# Patient Record
Sex: Male | Born: 1960 | Race: Black or African American | Hispanic: No | Marital: Single
Health system: Southern US, Community
[De-identification: ages and names within clinical notes are randomized; demographics above are authoritative.]

## PROBLEM LIST (undated history)

## (undated) DIAGNOSIS — B019 Varicella without complication: Secondary | ICD-10-CM

## (undated) DIAGNOSIS — M79609 Pain in unspecified limb: Secondary | ICD-10-CM

## (undated) DIAGNOSIS — G4700 Insomnia, unspecified: Secondary | ICD-10-CM

## (undated) DIAGNOSIS — G4733 Obstructive sleep apnea (adult) (pediatric): Secondary | ICD-10-CM

## (undated) DIAGNOSIS — J3489 Other specified disorders of nose and nasal sinuses: Secondary | ICD-10-CM

## (undated) HISTORY — DX: Other specified disorders of nose and nasal sinuses: J34.89

## (undated) HISTORY — DX: Varicella without complication: B01.9

## (undated) HISTORY — PX: VASECTOMY: SHX001876

## (undated) HISTORY — DX: Obstructive sleep apnea (adult) (pediatric): G47.33

## (undated) HISTORY — DX: Pain in unspecified limb: M79.609

## (undated) HISTORY — DX: Insomnia, unspecified: G47.00

---
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
## 2006-09-16 ENCOUNTER — Ambulatory Visit

## 2006-10-18 ENCOUNTER — Ambulatory Visit: Admitting: FAMILY PRACTICE

## 2006-10-18 ENCOUNTER — Encounter: Payer: Self-pay | Admitting: FAMILY PRACTICE

## 2006-10-18 ENCOUNTER — Ambulatory Visit

## 2006-10-18 DIAGNOSIS — J3489 Other specified disorders of nose and nasal sinuses: Secondary | ICD-10-CM | POA: Insufficient documentation

## 2006-10-18 DIAGNOSIS — M79609 Pain in unspecified limb: Secondary | ICD-10-CM | POA: Insufficient documentation

## 2006-10-18 DIAGNOSIS — G4700 Insomnia, unspecified: Secondary | ICD-10-CM | POA: Insufficient documentation

## 2006-10-18 HISTORY — DX: Insomnia, unspecified: G47.00

## 2006-10-18 HISTORY — DX: Other specified disorders of nose and nasal sinuses: J34.89

## 2006-10-18 HISTORY — DX: Pain in unspecified limb: M79.609

## 2006-10-18 MED ORDER — AMBIEN CR 6.25 MG TABLET,EXTENDED RELEASE
EXTENDED_RELEASE_TABLET | ORAL | Status: DC
Start: 2006-10-18 — End: 2007-01-16

## 2006-10-18 MED ORDER — FLONASE 50 MCG/ACTUATION NASAL SPRAY,SUSPENSION
NASAL | Status: DC
Start: 2006-10-18 — End: 2009-05-09

## 2006-10-18 NOTE — Nursing Note (Signed)
>>   Wynona Luna, LVN, LVN     Mon Oct 18, 2006 10:33 AM  Immunization VIS documentation(s) were given to Tish Men to review. All questions were answered and the patient consented to the Immunization(s) being given. Patient allergies were reviewed and no contraindications were found. The immunization(s) were given as ordered. The patient was observed for any immediate reactions to the vaccine. None were observed. Wynona Luna, LVN    >> AMELIA Hinton Dyer, LVN     Mon Oct 18, 2006 10:07 AM  Vital signs taken, allergies verified, screened for pain, med hx taken (if appropriate),  screened for chicken pox, and verified immunization status.  Warren Lacy, LVN

## 2006-10-18 NOTE — Patient Instructions (Addendum)
New lab tests have been ordered. Please do these tests after a 12 hour fast (nothing to eat or drink except water) in 1-2 weeks. The lab tests have been ordered through the computer, no paper lab slip is needed. The lab is open from 8:00 AM to 4:15 Monday to Friday, and an appointment is needed at the lab. The lab is located on the first floor of the clinic building in the waiting area for [address].    HOW DO I E-MAIL MY DOCTOR?  To contact your physician by e-mail go on-line at [email or web address]. Look up the name of your primary care physician, then you're ready to send a secured, confidential e-mail.

## 2006-10-18 NOTE — Progress Notes (Signed)
See dictation.  Barriers to Learning: none. Patient/Family Understanding: verbalizes.    Kodee Ravert C Keir Viernes, M.D.

## 2006-10-19 NOTE — H&P (Addendum)
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 He has a history of chronic foot pain related to an old soccer injury. He recently developed an abscess requiring incision and drainage on the same foot. He has seen multiple podiatrists in the past. He is interested in seeing a podiatrist. Cannot think of the podiatrist's name. He has a history of insomnia. Uses Ambien CR intermittently. He has a history of snoring. He has used nasal steroid. Is interested in retrying nasal steroid. No fever, chills. No cough. No congestion. No change in bowel or bladder pattern.     PAST HISTORY:    Right foot pain, insomnia, possible seasonal allergic rhinitis. RGERY: Appendectomy at 5. ALLERGIES: PENICILLIN. CURRENT MEDS: Ambien CR p.r.n., Benadryl p.r.n.    HABITS:    No tobacco. Quit alcohol four months ago. No recreational drugs.     SOCIAL HISTORY:    Is an Conservator, museum/gallery. Single. One son age 31. Has a significant other.    FAMILY HISTORY:    Mother with congestive failure. Father died of a stroke. Had hypertension.     HEALTH CARE MAINTENANCE:    Last tetanus 10 years ago. Had a cholesterol couple years ago, which as far as he knows was normal. He has no other risk factors for cardiovascular disease.    OBJECTIVE:    Blood pressure 120/60, temperature 36.1, pulse 80, respiratory rate 20, weight 167, height 5 feet 7 inches, BMI 26. In general, he appears comfortable. Sclerae are anicteric. Conjunctivae pink. Nares patent. Oropharynx moist. Neck veins flat. Lungs: Clear. Heart: Regular rate and rhythm. Abdomen is soft, nontender. Extremities are warm. No cyanosis. No edema. Prostate is normal, no masses.    ASSESSMENT:     1. Health care maintenance. Plan: Nutritional counseling. Exercise counseling. Education regarding safety. Will provide him a  DTAP. Will check a lipid and a comprehensive panel.   2. Right foot pain. History of infection. Plan: Obtain old records. Check blood count, C-reactive protein. Will let me know the name of the physician he would like to see and I will complete a referral.  3. Insomnia. Plan: Refill Ambien CR.  4. Snoring. May be allergy. Plan: Trial of Flonase. Further evaluation pending response to therapy.          THIS WAS ELECTRONICALLY SIGNED - 10/19/2006 9:56 AM PST BY: Cristal Generous, MD  [facility] [place]  [facility]                [id number])    D: 10/18/2006 04:44 PM  T: 10/18/2006 06:59 PM  C#: [id number]

## 2006-10-28 ENCOUNTER — Encounter

## 2006-11-01 ENCOUNTER — Other Ambulatory Visit: Payer: Self-pay | Admitting: FAMILY PRACTICE

## 2006-11-02 ENCOUNTER — Telehealth: Payer: Self-pay | Admitting: FAMILY PRACTICE

## 2006-11-02 NOTE — Telephone Encounter (Signed)
Please schedule appointment tomorrow so I can check his arm.  Iwould also like to discuss results of lab test.

## 2006-11-02 NOTE — Telephone Encounter (Signed)
Patient had labs done on October 28, 2006 having blood drawn from right arm below elbow.  He has a bruise that he just noticed that is about the size of his fist.  It is sore but not anything major.  He usually does not bruise.  He is not really concerned about it but was surprised.  Please call to advise.    Beryle Beams

## 2006-11-03 NOTE — Telephone Encounter (Signed)
Spoke to pt. Offered appt byt he ws in [place]. Pt advised to call if symtoms get worse or do not get better for advisement on what to do while out of the state.

## 2006-11-09 ENCOUNTER — Encounter: Payer: Self-pay | Admitting: FAMILY PRACTICE

## 2006-11-16 ENCOUNTER — Ambulatory Visit

## 2006-11-19 ENCOUNTER — Encounter

## 2006-11-19 ENCOUNTER — Telehealth: Payer: Self-pay | Admitting: FAMILY PRACTICE

## 2006-11-19 NOTE — Telephone Encounter (Signed)
Call patient and tell him labs results will not be available until next week.  I will notify him of the results when available.

## 2006-11-19 NOTE — Telephone Encounter (Signed)
Patient had lab work done today and isnt sure if he needs a follow up appointment to discuss these labs, please call to let him know,  Amy Stilwell,mOSC II

## 2006-11-22 NOTE — Telephone Encounter (Signed)
Patient notified. Sonnie Pawloski Raya-Rowe, MA II

## 2006-11-24 DIAGNOSIS — R894 Abnormal immunological findings in specimens from other organs, systems and tissues: Secondary | ICD-10-CM | POA: Insufficient documentation

## 2007-01-16 ENCOUNTER — Other Ambulatory Visit: Payer: Self-pay | Admitting: FAMILY PRACTICE

## 2007-01-16 MED ORDER — AMBIEN CR 6.25 MG TABLET,EXTENDED RELEASE
EXTENDED_RELEASE_TABLET | ORAL | Status: DC
Start: 2007-01-16 — End: 2007-11-20

## 2007-07-25 ENCOUNTER — Ambulatory Visit

## 2007-07-27 ENCOUNTER — Ambulatory Visit: Admitting: FAMILY PRACTICE

## 2007-07-27 MED ORDER — AMBIEN 10 MG TABLET
ORAL_TABLET | ORAL | Status: DC
Start: 2007-07-27 — End: 2007-11-20

## 2007-07-27 NOTE — Patient Instructions (Signed)
An electronic referral has been submitted to [facility] for further evaluation and treatment of your problem. You should receive an approval notice in the mail within 2 weeks. After the approval notice has been received, or if a notice is not received within two weeks, you may call [phone] or [phone] to schedule your appointment.        An electronic referral has been submitted to DERMATOLOGY for further evaluation and treatment of your problem. You should receive an approval notice in the mail within 2 weeks. After the approval notice has been received, or if a notice is not received within two weeks, you may call [phone] or [phone] to schedule your appointment.

## 2007-07-27 NOTE — Nursing Note (Signed)
>>   Dustin Ibarra     Wed Jul 27, 2007 10:55 AM  Vitals Taken. [name], [facility]

## 2007-07-27 NOTE — Progress Notes (Signed)
SUBJECTIVE: Dustin Ibarra is a 46yr old male who comes in today for the following problem(s):     Chief Complaint   Patient presents with    Mole     upper Lt side of back     Patient states that he has had a mole on his left upper back for a number of years that has recently changed. He states that his girlfriend noticed it to be getting bigger in size and changing in shape. He denies any bleeding or pain. He states that he has sensitive skin and has had sunburns in the past. No family history of skin cancers though his father had some skin growths removed.    He also complains of having sleep problems. He is currrently taking Ambien CR 6.25mg , but is still having trouble sleeping. He states that he snores and was wondering if a sleep study would be appropriate.    OBJECTIVE: BP 120/70  Pulse 72  Temp (Src) 37 C (98.6 F) (Oral)  Resp 12  Wt 78.926 kg (174 lb)     General Appearance: distress - no distress.  Skin:  Left upper back: asymmetrical, dark brown to black lesion with irregular borders.   Approximately 9 x9 mm in greatest length and width.  Neck and axilla: no adenopathy appreciated      ASSESSMENT/PLAN    216.9AC Mole (Skin)  Comment: DDx: superficial spreading melanoma  Plan: DERMATOLOGY CLINIC REFERRAL           780.52 INSOMNIA UNSPECIFIED  Comment: DDx: sleep apnea  Plan: AMBIEN 10 MG TAB, SLEEP STUDY    Patient visit was proctored by Dr. Renette Butters.    See Orders, Patient Instructions, and Follow-up sections.    Barriers to Learning assessed: none. Patient verbalizes understanding of teaching and instructions.    [name], M.D.

## 2007-07-27 NOTE — Progress Notes (Signed)
See dictation.  Barriers to Learning: none. Patient/Family Understanding: verbalizes.    Quandarius Nill C Adalee Kathan, M.D.

## 2007-07-28 NOTE — Progress Notes (Addendum)
PATIENT: Dustin Ibarra LOCATION: [facility]   MR #: [id number] SEX: M AGE: 46  DATE OF SERVICE: 07/27/2007 DOB: Jul 23, 1961    [place] CLINIC NOTE    Dustin Ibarra is a 46 year old male who is presenting for evaluation of a mole on his left shoulder. It has been present since his teens. He has had it evaluated by physicians in the past. Recently was seen by a nurse practitioner who felt it had some suspicious characteristics and thought he should be evaluated by me. Dustin Ibarra is bothered by insomnia. Has snoring, daytime somnolence. He is interested in a sleep study.     PAST HISTORY:    Unchanged. Reviewed on problem list. See problem list. CURRENT MEDS: Unchanged. Reviewed on med list. See med list.    OBJECTIVE:    Vital signs are stable. He is afebrile. On his left shoulder he has a 9 mm unevenly pigmented with uneven borders and asymmetric lesion that resembles melanoma. Lungs: Clear. Heart: Regular rate and rhythm. Abdomen is soft, nontender. Extremities are warm.     ASSESSMENT:     1. Left upper back mole suspicious for melanoma. Plan: Urgent derm referral.  2. Insomnia, some features of obstructive sleep apnea. Plan: Referral for sleep study. Will change Ambien CR to Ambien 10 mg. Reviewed risks, benefits. He will followup with me after sleep study has been completed and derm eval completed.        THIS WAS ELECTRONICALLY SIGNED - 07/28/2007 6:26 AM PST BY: Cristal Generous, MD  [facility] [place]  [facility]                [id number])    D: 07/27/2007 03:24 PM  T: 07/27/2007 07:26 PM  C#: [id number]

## 2007-08-02 ENCOUNTER — Ambulatory Visit

## 2007-08-03 ENCOUNTER — Ambulatory Visit: Admitting: Dermatology

## 2007-08-03 ENCOUNTER — Other Ambulatory Visit: Payer: Self-pay | Admitting: Dermatology

## 2007-08-03 VITALS — BP 118/72 | HR 88 | Resp 20

## 2007-08-03 NOTE — Nursing Note (Signed)
>>   Inocencio Homes, RN     Wed Aug 03, 2007  6:21 PM  Post-op picture final layer/measurements taken?  yes -   Post closure picture / measurements taken?  no  Dressing:  yes - vasaline telfa pressure drsg applied Inocencio Homes, RN    Wound care Instructions given and patient understanding?  yes - rev'd instructions w/ patient and sig other,all questions answered and phone numbers given      Inocencio Homes, RN    >> Inocencio Homes, RN     Wed Aug 03, 2007  5:10 PM  Introduced self when first meeting pt:    yes -     2 Identifiers used to identify pt- Name / Birth date:  yes -   Family shown to Mohs waiting room:  no    Patient takes Aspirin /Vitamin E:   no    Patient On Anticoagulants:   no  Does patient need ABx before Dental Work:  no  Artificial implants/Catheters:  no  Physician notified if YES for any above:  yes -   Consent completed / explained /and Signed by pt.:  yes - Dr Hilda Blades  Armband on?  no  Has pt eaten?  yes -     If Yes, when? Today 15:00, last?   Referring Physician documented?:   yes - ,  Histology / Diagnosis documented:   yes - ,r/oatypical nevus  Allergies checked:   yes -  ,   -- Pcn (Penicillin) (Penicillins) -- Hives    --  Given shot for reaction.  Photos present:   no  Surgery site agreed to by patient prior to procedure:  yes -   Anesthetic labeled and available:  yes -   Vitals taken and charted:   yes - , BP 118/72   Pulse 88   Resp 20  Time out taken:   yes -      Mindy Epperson,RN    >> EMMA COVINGTON, LVN     Wed Aug 03, 2007  4:20 PM  Vital signs taken, allergies verified, screened for pain, med hx taken.  Kara Mead [place],LVN

## 2007-08-04 ENCOUNTER — Encounter: Payer: Self-pay | Admitting: FAMILY PRACTICE

## 2007-08-04 NOTE — Progress Notes (Signed)
Dear Dr. Renette Butters,    Thank you for this consultation.    HPI: As you know, [name] is a delightful 46 year-old man who presents to our dermatology clinic at your request in consultation for evaluation and treatment of a mole on his left shoulder. The patient reports this mole, which has been "present since birth", has been growing in size and changing in color over the last 1-2 year(s). Lastly, Dustin Ibarra presents with a 52-month history of a "rough spot" above his left eyebrow.    The patient reports moderate sun exposure but no blistering sunburns as a child. He does not use sunscreens with SPF 30 daily.  Of note, patient has no history of skin cancer and no known family history of melanoma.    [Elements in the HPI above include primary problem, its associated location, duration, and quality, as well as any additional problems the patient is presenting at this visit.]    Review of systems: All systems negative except as noted in the HPI.  Education needs were identified. There were no barriers to learning.  Past Medical, Family, Social History reviewed in EMR and with the patient on 08/03/07.  No changes are noted.  Allergies: reviewed as per patient questionnaire dated 08/03/07.  Medications: reviewed as per patient questionnaire dated 08/03/07.    Physical Examination/ Assessment/ Plan  On physical examination, the patient is in no apparent distress and breathing comfortably. Vital signs were noted. The patient is well nourished and pleasant. The patient is alert to time, place and person. Inspection of the head, face, neck, chest, back including spine, abdomen, buttocks, right and left upper and lower extremities was performed. The examination of the above areas are within normal limits, except for any abnormalities are noted below.    Assess for melanoma: 9x62mm dark dark-brown patch with irregular borders and a hyperpigmented center located on the left shoulder.  --Excisional biopsy was  performed.    Preoperative diagnosis: assess for melanoma  Postoperative diagnosis: assess for melanoma  Location: left shoulder  Preoperative medications: none  Postoperative medications: none  Anesthetic:  Buffered 1% lidocaine 1:100,000 epinephrine.  Antiseptic:  Chlorhexidine  Attending surgeon: Airen Stiehl MD  Assistant(s): Hayden Pedro MD , Mindy Epperson RN  Estimated blood loss: minimal  Complications: none  Initial lesion size: 9x51mm  Surgical margin: 2mm  Excised lesion size: 1.1cmx1.1cm  Closure type: intermediate layered closure  Length of closure: 2.8 cm  Suture material: 4.0 prolene, 4.0 vicryl    Dustin Ibarra was brought back to the minor surgery operating room.  A time out was performed to confirm his identity and the correct operative site, which he pointed out and which was correlated with chart information. The risks of the procedure, including bleeding, infection, nerve damage, possibility of recurrence, failure of the repair, and the certainty of a scar were discussed with him. Alternatives to the procedure, as well as their risks and benefits, were also discussed.  He was offered an opportunity to ask any questions and, after expressing understanding of the proposed procedure and its alternatives, he signed the consent form. He was placed in a recumbent position and the area was prepared and draped in the usual manner.  Local anesthesia was obtained with the above mentioned anesthetic. Using a sterile surgical marking pen, a eliptical skin incision template was designed around the lesion and along relaxed skin tension lines, incorporating a 2 mm margin of normal-appearing skin. A full thickness skin incision using a #15 blade  Bard-Parker scalpel was performed and the lesion was excised sharply in the subcutaneous plane. The specimen was submitted for histopathologic examination. An intermediate layered closure was performed. Hemostasis was maintained with the electrosurgical unit. Interrupted,  inverted, subcuticular buried mattress sutures were placed using the material mentioned in the summary. Running/Interrupted simple cutaneous sutures were used to further approximate and evert the wound edges. The final length of the repair is listed in the summary above. A firm pressure dressing was applied over bacitracin ointment and Telfa. Verbal postoperative wound care instructions were discussed and a wound care hand out was given. He was asked to follow up in 2 weeks for a wound check/suture removal and discussion of the histopathology report. He was also told to call us at anytime for signs of wound infection or if he has any other concerns regarding the surgery or the healing process. He tolerated the procedure well and walked from the minor surgery operating room without apparent ill effect.     Actinic Keratosis: Total of 1 erythematous, gritty patch located above the left eyebrow.  --After obtaining verbal consent, 1 lesion was treated with liquid nitrogen x 2 with a 10 second thaw interval. Patient was informed of the potential for erythema, blister formation, scar, and post inflammatory pigment changes at the site of treatment.    Benign-appearing nevi - elsewhere: Multiple tan to brown macules and papules with even border and color and benign pigment network under epiluminescence microscopy on the trunk, upper extremities, and lower extremities.  --Pt was encouraged to perform self-skin examination approximately once a month to observe any changes in these currently benign-appearing nevi.    Cherry angiomas: Scattered brightly erythematous, vascular papules on the trunk and extremities.  --Benign nature of these skin lesions was explained to the patient.    Solar lentigines: Multiple tan to brown, reticulate macules with moth-eaten border on ELM consistent with lentigines on the face, upper back, and arms.  --Wynelle Link protection was reviewed with the patient, including using a sunscreen with broadspectrum  protection with SPF of at least 30 and above, with frequent re-application.    Thank you for allowing me to participate in the care of this patient.  We have asked Mr. Ranieri to return to clinic in 12 months or sooner as needed.    Hayden Pedro, MD  Clinical Fellow    I performed the procedures. This patient was seen, evaluated, and care plan was developed with the fellow. I agree with the findings and plan as outlined in the fellow's note above.     [name], MD  Assistant Clinical Professor of Dermatology

## 2007-08-06 ENCOUNTER — Encounter: Payer: Self-pay | Admitting: Dermatology

## 2007-08-06 DIAGNOSIS — D039 Melanoma in situ, unspecified: Secondary | ICD-10-CM | POA: Insufficient documentation

## 2007-08-16 ENCOUNTER — Telehealth: Payer: Self-pay | Admitting: Dermatology

## 2007-08-16 ENCOUNTER — Ambulatory Visit

## 2007-08-16 NOTE — Nursing Note (Signed)
>>   Dustin Ibarra     Tue Aug 16, 2007  1:55 PM  Running Sutures removed from left shoulder. [email or web address]

## 2007-08-16 NOTE — Telephone Encounter (Addendum)
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 Patient's last visit with me was on August 03, 2007.  I discussed the significance, prognosis, and treatment pertaining to his melanoma in situ.  Dustin Ibarra will have a re-excision appointment with me on September 07, 2007.    [name], MD

## 2007-08-16 NOTE — Progress Notes (Signed)
Telephone Encounter   08/16/2007    I spent 22 minutes discussing with the patient significance of his diagnosis of melanoma in situ. His last visit to my office was on 07/31/07. I explained the significance, prognosis, and the treatment for melanoma in situ.  I have asked Dustin Ibarra to return to my clinic for re-excision of this lesion on September 07, 2007.    [name], MD

## 2007-08-31 ENCOUNTER — Encounter: Payer: Self-pay | Admitting: FAMILY PRACTICE

## 2007-09-01 ENCOUNTER — Ambulatory Visit

## 2007-09-07 ENCOUNTER — Encounter: Admitting: FAMILY PRACTICE

## 2007-09-07 ENCOUNTER — Ambulatory Visit

## 2007-09-07 ENCOUNTER — Other Ambulatory Visit: Payer: Self-pay | Admitting: Dermatology

## 2007-09-07 ENCOUNTER — Ambulatory Visit: Admitting: Dermatology

## 2007-09-07 VITALS — BP 136/84 | HR 74 | Resp 16

## 2007-09-07 NOTE — Progress Notes (Signed)
09/07/2007    HPI:  Mr. 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  He had no complications with healing after his last biopsy.  At his last visit, he had an actinic keratosis on his left eyebrow treated with cryotherapy, which he now believes is gone.   He has no new complaints today.    Review of systems: All systems negative except as noted in the HPI.  Education needs were identified. There were no barriers to learning.  Past Medical, Family, Social History reviewed in EMR and with the patient on 08/03/07.  No changes are noted.  Allergies: reviewed as per patient questionnaire dated 08/03/07.  Medications: reviewed as per patient questionnaire dated 08/03/07.    Physical Examination/ Assessment/ Plan  On physical examination, the patient is in no apparent distress and breathing comfortably. Vital signs were noted.  The patient is alert to time, place and person. Inspection of the head, face, and back was performed. The examination of the above areas are within normal limits, except for any abnormalities are noted below.    -- 2.2 x 1.0 cm erythematous scar on left posterior shoulder  -- no evidence of residual actinic keratosis on left eyebrow    Assessment and plan for melanoma in situ:    Re-excision with 5 mm margins today    Procedure Type: Excision  Location: left posterior shoulder  Preoperative diagnosis: melanoma in situ  Postoperative diagnosis: same  Preoperative medications: none  Antiseptic: chlorhexidine  Anesthesia: 12.5 cc total of 1% Lidocaine with epinephrine   Size of Lesion: 2.2 cm  Margin: 5 mm  Length of Closure: 6.2 cm  Sutures Used: 3-0 vicryl, 4-0 prolene  Closure type: intermediate  Drains: None  Estimated blood loss: minimal  Complications: none  Findings: none  Sterile Bandage and antibiotic applied: yes  Specimen sent to Pathology: yes  Discharge Medications: none  Patient  Information given: yes  The patient was brought back to the minor surgery operatory.  A time out was performed to confirm the patient's identity and the correct operative site, which the patient pointed out. The risks of the procedure, including a certain scar, bleeding, infection, nerve damage, failure to accomplish the goals of the surgery, and failure of the procedure were explained to the patient. Alternatives to the procedure were also discussed and the patient was offered an opportunity to ask any questions.  After expressing understanding of the proposed procedure, the patient signed the consent form.     The patient was placed in a recumbent position and the area was prepped and draped in the usual manner. Local anesthesia was obtained with infiltration of the above mentioned anesthetic. Using a sterile surgical marking pen, an elliptical skin incision was designed around the lesion and along relaxed skin tension lines, if possible, incorporating the aforementioned margin of normal-appearing skin to assure removal of the lesion.  A full thickness of skin incision using a 15 blade Bard-Parker scalpel was performed and the lesion was excised sharply to the subcutaneous plane.  The specimen was submitted for histopathologic examination.     The defect was moderately undermined in the subcutaneous plane to reduce wound closure tension and allow for easy wound edge eversion.  Hemostasis was maintained with the electrosurgical unit.  Interrupted, inverted, subcuticular buried mattress sutures were placed using the material mentioned above to approximate the dermal edges of the  wound.    Interrupted and running simple cutaneous sutures were used to further approximate and evert the wound edges.  The final length of the repair is listed in the summary above.     A firm pressure dressing was applied over bacitracin ointment and Telfa.   Verbal postoperative wound care instructions were given and a wound care hand out  was dispensed.  The patient was asked to follow up in 2 weeks for a wound check/suture removal and discussion of the histopathology report. The patient was also told to call us at anytime for signs of wound infection or any other concerns they might have regarding the surgery or the healing process. The patient tolerated the procedure well and ambulated from the minor surgical operatory.      Actinic Keratosis:  There is evidence of residual actinic keratosis on exam.  There is no need for further treatment at this time.    Thank you for allowing me to participate in the care of this patient.  We have asked Mr. Kagel to return to clinic in 2 weeks for suture removal and again in 3 months for full skin exam.    The patient was staffed with Dr. Hilda Blades.    Jacquelynn Cree, MD

## 2007-09-07 NOTE — Nursing Note (Signed)
>>   Evalina Field, MA     Wed Sep 07, 2007  5:14 PM  Post-op picture final layer/measurements taken?  yes -   Post closure picture / measurements taken?  yes -   Dressing:  yes - Pressure Dressing by Evalina Field.M.A.  Wound care Instructions given and patient understanding?  yes -      >> Inocencio Homes, RN     Wed Sep 07, 2007  4:46 PM  Patient prepared for reexcision of L back near shoulder by Dr Hilda Blades.    >> Evalina Field, MA     Wed Sep 07, 2007  3:56 PM  vital signs taken, allergies verified, screened for pain, meds verified, pharmacy verified.  Evalina Field, MA I

## 2007-09-08 NOTE — Progress Notes (Signed)
I was present throughout the procedure. This patient was seen, evaluated, and care plan was developed with the resident. I agree with the findings and plan as outlined in the resident's note above.     April Armstrong, MD  Assistant Clinical Professor of Dermatology

## 2007-09-13 ENCOUNTER — Telehealth: Payer: Self-pay | Admitting: Dermatology

## 2007-09-13 NOTE — Telephone Encounter (Signed)
Patient returning your call. You may leave a detailed message on his cell phone voice mail. Leander Rams, Sr. [place]

## 2007-09-20 ENCOUNTER — Ambulatory Visit

## 2007-09-21 ENCOUNTER — Ambulatory Visit

## 2007-09-21 NOTE — Nursing Note (Signed)
>>   Inocencio Homes, RN     Wed Sep 21, 2007  1:50 PM  Pt here for suture removalon L posterior shoudler. Suture line approximated. No bleeding patient states no pain and left feeling well. Inocencio Homes, RN

## 2007-09-28 ENCOUNTER — Telehealth: Payer: Self-pay

## 2007-09-28 NOTE — Telephone Encounter (Signed)
Called patient. Small amount of bleeding stopped.

## 2007-09-28 NOTE — Telephone Encounter (Signed)
Pt called wanting to know if a small amount of bleeding was concerning.  Pt had an exc on R shoulder, sutures were removed last week. Pt awoke to a quarter size spot of blood on shirt.  Pt states he has been doing more this week.  Suggested slowing down and apply bandaid.  Inocencio Homes, RN

## 2007-10-12 ENCOUNTER — Ambulatory Visit

## 2007-10-12 NOTE — Nursing Note (Signed)
>>   Inocencio Homes, RN     Wed Oct 12, 2007  2:36 PM  Pt c/o pea size bleeding nightly from Lt post shoulder exc site.  Reexc was on 1/21.  Denies pain. Two spitting sutures removed on both lateral ends.  No bleeding noted.  Pt directed to keep sutureline clean and covered especially in bed.  Inocencio Homes, RN

## 2007-11-20 ENCOUNTER — Other Ambulatory Visit: Payer: Self-pay | Admitting: FAMILY PRACTICE

## 2007-11-20 MED ORDER — AMBIEN 10 MG TABLET
ORAL_TABLET | ORAL | Status: DC
Start: 2007-11-20 — End: 2008-05-22

## 2007-12-14 ENCOUNTER — Ambulatory Visit

## 2007-12-14 ENCOUNTER — Ambulatory Visit: Admitting: Dermatology

## 2007-12-14 VITALS — BP 108/80 | HR 86 | Resp 20

## 2007-12-14 NOTE — Progress Notes (Signed)
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 Today complaining of lesion on penis, present for "a long time," not sexually active, asymptomaic, concerned might be genital wart.     The patient's medical problem list was reviewed. The patient's ROS questionnaire dated 12/08 was reviewed, no changes were noted. Education needs were identified. There were no barriers to learning.    Physical Exam:   The patient is in no apparent distress and breathing comfortably.  Vital signs were noted. The patient is well nourished and pleasant, alert to time, place and person.  Inspection of the face, neck, chest, back, abdomen, upper extremities, lower extremities, buttocks, and genitals was performed.  The examination was found to be normal, with the following abnormalities:   - L upper back: well-healed scar, small hypertrophic scar at L edge  - Shaft of penis: two 7mm sharply-marginated papillomatous light brown flat papules, superior one is very flat  - No axillary or cervical lymphadenopathy     Assessment/Plan:   1. Genital warts:  - After obtaining verbal consent, 2 lesions were treated with liquid nitrogen for ~5 seconds, with a ~10 second thaw interval. The patient was informed of the expected skin reaction including erythema, pain, blister formation, and advised of the risk of hypo- or hyperpigmentation and scar at the site of treatment. The patient tolerated the treatment well.  - Return to clinic in 1 month    2. History of melanoma in situ on upper L back, excised 1/09:  - No evidence of recurrence or concerning lesions on exam today   - Q6 month full skin examinations

## 2007-12-14 NOTE — Nursing Note (Signed)
>>   SONIA NAEBKHAIL, MA     Wed Dec 14, 2007  1:15 PM  vital signs taken, allergies verified, screened for pain, meds verified, pharmacy verified.  Evalina Field, MA I

## 2007-12-14 NOTE — Progress Notes (Signed)
I was present throughout the procedure. This patient was seen, evaluated, and care plan was developed with the resident. I agree with the findings and plan as outlined in the resident's note above.     April Armstrong, MD

## 2008-02-15 ENCOUNTER — Ambulatory Visit

## 2008-02-15 ENCOUNTER — Ambulatory Visit: Admitting: Dermatology

## 2008-02-15 VITALS — BP 102/78 | HR 80 | Resp 14

## 2008-02-15 NOTE — Progress Notes (Signed)
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 Patient reports that the genital wart has resolved. The patient's medical problem list was reviewed. The patient's ROS questionnaire dated 12/08 was reviewed, no changes were noted. Education needs were identified. There were no barriers to learning.    Physical Exam:   The patient is in no apparent distress and breathing comfortably.  Vital signs were noted. The patient is well nourished and pleasant, alert to time, place and person.  Inspection of the face, neck, chest, back, abdomen, upper extremities, lower extremities, buttocks, and genitals was performed.  The examination was found to be normal, with the following abnormalities:   - L upper back: well-healed scar, small hypertrophic scar at L edge  - Shaft of penis: clear  - No axillary or cervical lymphadenopathy     Assessment/Plan:   1. Genital warts:  - After obtaining verbal consent, 2 lesions were treated with liquid nitrogen for ~5 seconds, with a ~10 second thaw interval. The patient was informed of the expected skin reaction including erythema, pain, blister formation, and advised of the risk of hypo- or hyperpigmentation and scar at the site of treatment. The patient tolerated the treatment well.  - Return to clinic in 1 month    2. History of melanoma in situ on upper L back, excised 1/09:  - No evidence of recurrence or concerning lesions on exam today   - Q6 month full skin examinations     Benign-appearing nevi: Multiple tan to brown macules and papules with even border and color and benign pigment network Epiluminescence microscopy on the trunk, upper extremities, and lower extremities.  --Pt was encouraged to perform self-skin examination approximately once a month to observe any changes in these currently benign-appearing nevi.    Seborrheic Keratoses: Multiple brown, verrucous, stuck-on papules on  the trunk.  --Reassurance was given as to the benignity of these growths.    Cherry angiomas: Scattered brightly erythematous, vascular papules on the trunk and extremities.  --Benign nature of these skin lesions was explained to the patient.    Solar lentigines: Multiple tan to brown, reticulate macules with moth-eaten border on ELM consistent with lentigines on the face, upper back, and arms.  --Wynelle Link protection was reviewed with the patient, including using a sunscreen with broadspectrum protection with SPF of at least 30 and above, with frequent re-application.      Thank you for allowing me to participate in the care of this patient.  We have asked [name] to return to clinic in 6 months or sooner as needed.    Sonia Side, MD

## 2008-02-15 NOTE — Nursing Note (Signed)
>>   Patrick North, MA     Wed Feb 15, 2008  1:02 PM  Patient BP, pulse and respirations taken. Allergies updated and verified. Medications updated. Screened for pain. Pharmacy information updated.  Patrick North MA I

## 2008-03-28 ENCOUNTER — Ambulatory Visit

## 2008-05-22 ENCOUNTER — Telehealth: Payer: Self-pay | Admitting: FAMILY PRACTICE

## 2008-05-22 MED ORDER — ZOLPIDEM 10 MG TABLET
1.0000 | ORAL_TABLET | Freq: Every day | ORAL | Status: DC | PRN
Start: 2008-05-22 — End: 2009-04-24

## 2008-05-22 NOTE — Telephone Encounter (Signed)
Patient walked into pharmacy and insisted they call primary care physician office for immediate refill on ambien.  Refill request created, and pharmacy advised to tell patient that covering physician would be seeing patients until approximately 5:30pm and may not be able to review messages any earlier.    Patient is apparently leaving town and needs refill today.

## 2008-05-22 NOTE — Telephone Encounter (Signed)
Call patient and tell him I refilled medication as requested.

## 2008-05-22 NOTE — Telephone Encounter (Addendum)
Left message on answering machine notifying patient of refill by Dr. Renette Butters.Lester Kinsman LVN

## 2008-05-23 ENCOUNTER — Other Ambulatory Visit: Payer: Self-pay | Admitting: FAMILY PRACTICE

## 2008-05-23 NOTE — Telephone Encounter (Signed)
Error

## 2008-05-29 ENCOUNTER — Ambulatory Visit

## 2008-06-13 ENCOUNTER — Ambulatory Visit: Admitting: FAMILY PRACTICE

## 2008-06-13 ENCOUNTER — Ambulatory Visit

## 2008-06-13 VITALS — BP 110/70 | HR 68 | Temp 98.6°F | Resp 16 | Wt 182.0 lb

## 2008-06-13 DIAGNOSIS — G4730 Sleep apnea, unspecified: Secondary | ICD-10-CM | POA: Insufficient documentation

## 2008-06-13 NOTE — Nursing Note (Signed)
>>   Dustin Ibarra     Wed Jun 13, 2008  5:29 PM  The patient completed the flu questionnaire and signed the consent. The Inactivated Influenza Vaccine VIS document was given to patient  to review and any questions were referred to the physician. The consent was then reviewed for any Yes answers.  All questions were answered as no. The Influenza Vaccine was then administered per protocol. The patient was observed for immediate reactions to the vaccine per protocol. None were observed.   Ellouise Newer, MA    >> [name]     Wed Jun 13, 2008  4:31 PM  Vital signs taken, allergies verified, screened for pain, med hx taken and chief complaint noted. Elease Hashimoto M.A.II

## 2008-06-13 NOTE — Progress Notes (Signed)
See dictation.  Barriers to Learning: none. Patient/Family Understanding: verbalizes.    Dustin Ibarra Dustin Ibarra, M.D.

## 2008-06-13 NOTE — Patient Instructions (Signed)
New lab tests have been ordered. Please do these tests in 1-2 weeks.  Fasting is NOT necessary.  The lab is open from 8:00 AM to 4:15 Monday to Friday and an appointment is needed.  No paper work is required as the orders exist in the electronic medical record. The lab is located on the first floor of the clinic building in the waiting area for [address].    An electronic referral has been submitted to RADIOLOGY for X-rays.  Please go to the X-ray department on the first floor to complete test.        An electronic referral has been submitted to ORTHOPEDICS for further evaluation and treatment of your problem. You should receive an approval notice in the mail within 2 weeks. After the approval notice has been received, or if a notice is not received within two weeks, you may call [phone] or [phone] to schedule your appointment.

## 2008-06-15 NOTE — H&P (Addendum)
PATIENT: Dustin, Ibarra LOCATION: [facility]   MR #: [id number] SEX: M AGE: 47   DATE OF SERVICE: 06/13/2008 DOB: 14-Jan-1961    [place] HISTORY AND PHYSICAL     Dustin Ibarra is a 47 year old man, who is presenting for annual exam. He is doing reasonably well. He continues to work closely with dermatologists. He has pain in his right great toe. He has been seeing an orthopedic surgeon in [place], treated with intraarticular steroids without much improvement. He denies fever, chills, cough, congestion, change in bowel or bladder pattern. He never followed through with consultation with the pulmonologist in regards to his mild sleep apnea.     PAST MEDICAL HISTORY:     Sleep apnea, melanoma in situ, right foot pain, insomnia, nasal cavity and sinus disease. Remote varicella, remote insomnia. Surgery: Appendectomy and melanoma excision. ALLERGIES: PENICILLIN. CURRENT MEDICATIONS: Ambien as needed for insomnia.     HABITS:     No tobacco, no alcohol, no recreational drug use.     SOCIAL HISTORY:     He is single and has one child. He works as an Conservator, museum/gallery.     IMMUNIZATIONS:    Had a tetanus shot 10/18/2006. Is requesting a flu shot today.     OBJECTIVE:     Blood pressure 100/70, temperature 37, pulse 68, respiratory rate 16, weight 182. In general, he looks comfortable. Sclerae: Anicteric. Conjunctivae: Pink. Nares: Patent. Oropharynx: Moist. Neck veins are flat. Lungs: Clear. Heart: Regular rate and rhythm. Abdomen is soft, nontender. Extremities are warm, there is no cyanosis, there is no edema. GU: Normal male genitalia. Testicles are descended bilaterally. Skin is unremarkable.     ASSESSMENT:     1. Health care maintenance. Plan: Nutritional counseling, exercise counseling, education regarding safety. Provide flu shot. Check vitamin D level.   2. Melanoma in situ. Plan: As per Derm.   3. Genital warts. Plan: These have been treated by Dermatology. Will screen for other STDs.   4. Sleep apnea. Plan: Refer to Pulmonology.   5.  Right 1st metatarsophalangeal joint pain, ongoing. Plan: Obtain radiographs. Refer to Orthopedic Surgery.           THIS WAS ELECTRONICALLY SIGNED - 06/15/2008 7:01 AM PST BY: Cristal Generous, MD    [facility]  [facility]                YQI:[id number])    D: 06/13/2008 06:31 PM  T: 06/14/2008 05:16 AM  C#: [id number]

## 2008-06-21 ENCOUNTER — Other Ambulatory Visit: Payer: Self-pay | Admitting: FAMILY PRACTICE

## 2008-06-21 ENCOUNTER — Encounter

## 2008-06-21 ENCOUNTER — Ambulatory Visit

## 2008-06-23 ENCOUNTER — Other Ambulatory Visit: Payer: Self-pay | Admitting: FAMILY PRACTICE

## 2008-06-23 DIAGNOSIS — E559 Vitamin D deficiency, unspecified: Secondary | ICD-10-CM | POA: Insufficient documentation

## 2008-06-23 MED ORDER — CHOLECALCIFEROL (VITAMIN D3) 25 MCG (1,000 UNIT) CAPSULE
1000.0000 [IU] | ORAL_CAPSULE | Freq: Every day | ORAL | Status: DC
Start: 2008-06-23 — End: 2008-10-22

## 2008-07-25 ENCOUNTER — Ambulatory Visit

## 2008-08-15 ENCOUNTER — Ambulatory Visit: Admitting: Dermatology

## 2008-08-15 NOTE — Progress Notes (Signed)
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 The patient's medical problem list was reviewed. His last full body skin check was on 02/15/2008.  Today. he reports that he has not noted any new, changing, itching, or bleeding lesions.     The patient's ROS questionnaire dated 08/15/08 was reviewed, no changes were noted. Education needs were identified. There were no barriers to learning.    Physical Exam:   The patient is in no apparent distress and breathing comfortably.  Vital signs were noted. The patient is well nourished and pleasant, alert to time, place and person.  Inspection of the face, neck, chest, back, abdomen, upper extremities, lower extremities, buttocks, and genitals was performed.  The examination was found to be normal, with the following abnormalities:   - L upper back: well-healed scar, small hypertrophic scar at L edge  - Shaft of penis: clear  - No axillary or cervical lymphadenopathy     Assessment/Plan:   1. History of melanoma in situ on upper L back, excised 1/09:  - No evidence of recurrence or concerning lesions on exam today, well-healed scar.  - Q6 month full skin examinations     2. Genital warts: resolved.    3. Benign-appearing nevi: Multiple tan to brown macules and papules with even border and color and benign pigment network Epiluminescence microscopy on the trunk, upper extremities, and lower extremities.  --Pt was encouraged to perform self-skin examination approximately once a month to observe any changes in these currently benign-appearing nevi.    4. Seborrheic Keratoses: Multiple brown, verrucous, stuck-on papules on the trunk.  --Reassurance was given as to the benignity of these growths.    5. Cherry angiomas: Scattered brightly erythematous, vascular papules on the trunk and extremities.  --Benign nature of these skin lesions was explained to the patient.    6. Solar  lentigines: Multiple tan to brown, reticulate macules with moth-eaten border on ELM consistent with lentigines on the face, upper back, and arms.  --Wynelle Link protection was reviewed with the patient, including using a sunscreen with broadspectrum protection with SPF of at least 30 and above, with frequent re-application.      Thank you for allowing me to participate in the care of this patient.  We have asked [name] to return to clinic in 6 months or sooner as needed.    Patient is staffed with Dr. Hilda Blades.    Derenda Mis, MD, PhD  Resident Physician, PGY-2  Pager Number: [phone]  Department of Dermatology  [facility]

## 2008-08-15 NOTE — Nursing Note (Signed)
>>   SONIA NAEBKHAIL, MA     Wed Aug 15, 2008  1:28 PM  vital signs taken, allergies verified, screened for pain, pharmacy verified.  Evalina Field, MA I

## 2008-08-17 NOTE — Progress Notes (Signed)
This patient was seen, evaluated, and care plan was developed with the resident. I agree with the findings and plan as outlined in the resident's note above.     April Wang Armstrong, MD  Attending Physician  Department of Dermatology  [facility]

## 2008-08-23 ENCOUNTER — Ambulatory Visit

## 2008-09-06 ENCOUNTER — Encounter: Payer: Self-pay | Admitting: FAMILY PRACTICE

## 2008-09-06 NOTE — Telephone Encounter (Signed)
From: Tish Men   ToSofie Rower   Sent: Thu Sep 06, 2008 1:38 PM   Subject: Referral Question    Hi Dr Renette Butters,  I was contacted by the orthopedic DR you referred me to for my foot but their contact info was lost.  Could you please send the info to me so I can schedule an appointment.    Also, your sleep clinic referral hasn't called to schedule yet so I need that as well.    Thank you,    Tish Men

## 2008-09-20 ENCOUNTER — Encounter: Payer: Self-pay | Admitting: FAMILY PRACTICE

## 2008-10-10 ENCOUNTER — Ambulatory Visit

## 2008-10-10 ENCOUNTER — Ambulatory Visit: Admitting: Orthopaedic Surgery

## 2008-10-10 VITALS — Resp 18 | Ht 69.0 in | Wt 180.0 lb

## 2008-10-10 NOTE — Nursing Note (Signed)
>>   Park Meo, LVN     Wed Oct 10, 2008  2:31 PM  vital signs taken, allergies/meds reviewed, pharmacy info. reviewed updated, chief complaint documented.

## 2008-10-16 ENCOUNTER — Telehealth: Payer: Self-pay | Admitting: FAMILY PRACTICE

## 2008-10-16 ENCOUNTER — Ambulatory Visit

## 2008-10-16 NOTE — Telephone Encounter (Signed)
Patient walked into clinic asking to be seen. Given Dr. Renette Butters instructions - refused to go to ER today. Encouraged to follow MD instructons. Patient decided to wait til 3/3 appointment.Lester Kinsman LVN

## 2008-10-16 NOTE — Telephone Encounter (Signed)
No available appts. ER?

## 2008-10-16 NOTE — Telephone Encounter (Signed)
Patient called stating that he has been passing blood but no stool comes out and patient says that yesterday a little stool came out but today nothing but blood and patient would like to know if Dr. Renette Butters could fit him in sometime today.    Please call back please advise

## 2008-10-16 NOTE — Telephone Encounter (Signed)
Please direct to closest emergency room.

## 2008-10-16 NOTE — Telephone Encounter (Signed)
Patient is here wanting to know if he can be worked in today. No appts in clinic. He has blood in stool and cramping since yesterday morning. Please advise if he should go to Er  Thank you,  Amy Stilwell, [facility] II

## 2008-10-17 ENCOUNTER — Ambulatory Visit: Admitting: FAMILY PRACTICE

## 2008-10-17 ENCOUNTER — Encounter

## 2008-10-17 VITALS — BP 116/78 | HR 76 | Temp 98.7°F | Resp 18 | Wt 180.6 lb

## 2008-10-17 NOTE — Progress Notes (Signed)
See dictation.  Barriers to Learning: none. Patient/Family Understanding: verbalizes.    Jadarius Commons C Betsi Crespi, M.D.

## 2008-10-17 NOTE — Nursing Note (Signed)
>>   Haskell Flirt, MA     Wed Oct 17, 2008 11:49 AM  patient screened, vitals completed, pharmacy verified, pain assessed.  ERobinson MA1

## 2008-10-18 NOTE — Progress Notes (Addendum)
PATIENT: Dustin Ibarra, Dustin Ibarra LOCATION: [facility]   MR #: [id number] SEX: M AGE: 48  DATE OF SERVICE: 10/17/2008 DOB: 06/20/1961    [facility] CLINIC NOTE    Dustin Ibarra is a 48 year old male who presents complaining of diarrhea an abdominal cramping. He was in his usual state up until Monday morning about 3:30. Woke with abdominal cramping, diarrhea. Had an episode of vomiting. Had frequent diarrheal stools over the next day or so. Noticed some blood and mucous in his stools. Had some chills. Currently he is feeling much better. His stool is somewhat loose with a little bit of blood this morning. Cramping is resolved. Diarrhea has resolved. He is able to tolerate diet. He had eaten shrimp at a casino in [place] on Saturday night and also had some leftover ravioli.     PAST MEDICAL HISTORY:     Unchanged. Reviewed on problem list. See problem list. CURRENT MEDS: Unchanged. Reviewed on med list. See med list.     OBJECTIVE:     Vital signs are stable. Afebrile. Lungs are clear. Heart: Regular rate and rhythm. Abdomen is soft, nontender. Extremities are warm. No cyanosis. No edema.     ASSESSMENT:     1. Bloody diarrhea. Suspect infectious gastroenteritis. Symptoms are improving. Plan: Obtain stool studies. Offered antibiotics; however, since he is improving, will hold off. Call if symptoms worsen or do not improve. Advance diet as tolerated. 2. Foot pain. Plan: As per ortho foot.         THIS WAS ELECTRONICALLY SIGNED - 10/18/2008 6:33 AM PST BY: Cristal Generous, MD    [facility] [place]  [facility]                HYQ:[id number])    D: 10/17/2008 12:08 PM  T: 10/17/2008 01:29 PM  C#: [id number]

## 2008-10-22 ENCOUNTER — Other Ambulatory Visit: Payer: Self-pay | Admitting: FAMILY PRACTICE

## 2008-10-25 NOTE — Progress Notes (Addendum)
PATIENT: Dustin Ibarra, [facility]   MR #: [id number] SEX: M AGE: 48  DATE OF SERVICE: 10/10/2008 DOB: [birth year]    ORTHOPAEDICS CLINIC NOTE    DIAGNOSIS: Right hallux rigidus, severe.    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 He noticed that he had gradual stiffness in this toe secondary to increased activity. He noticed that this is worse with activity. He does not recall any specific injury; however, he was a Banker for four years. He has no pain in the opposite side. No radiating numbness or weakness. No fevers, night sweats or chills. It limits his daily activities. He is not longer able to run long distances without pain.    PAST MEDICAL HISTORY: Includes sleep apnea, melanoma in situ.     ALLERGIES: PENICILLIN.    MEDICATIONS: Vitamin D and Ambien p.r.n.     SOCIAL HISTORY: He is healthy. He is a nondrinker. He is a nonsmoker. He works as an Art gallery manager.     REVIEW OF SYSTEMS: Reveals appendectomy in 1968. No gastric reflux. No fevers, night sweats or chills, shortness of breath, heart murmur or painful or frequent urination.    OBJECTIVE: On exam, he stands with a neutral hindfoot. Walks with a heel-toe gait pattern. Tibia and EHL, gastroc, peroneal  5/5. Sensory exam intact to  for light touch. DP and PT pulses are palpable. Ankle dorsiflexion 10, plantar flexion to 35. Range of motion about the right first toe reveals zero degrees dorsiflexion, 2 degrees plantar flexion. He has palpable dorsal osteophytes which are tender. EHL and FHL are 5/5. He has good capillary refill all toes.     X-RAYS: Three views show a grade III hallux rigidus.    IMPRESSION/PLAN: He has hallux rigidus. We feel at this juncture he has had all conservative measures, including steroid injections, as well as shoe wear changes. He will be a candidate for cartilectomy. We would like to first make sure that he does not have  significant hallux sesamoid OA; therefore, we will obtain a CT scan and see him back.     NOTE: Please note this patient was seen in consultation today on referral from Dr. Odella Aquas.            THIS WAS ELECTRONICALLY SIGNED - 10/25/2008 2:52 PM PST BY: Micheline Rough, MD  ASSISTANT PROFESSOR  DEPARTMENT OF ORTHOPAEDIC SURGERY                IO:[id number])    D: 10/10/2008 05:45 PM  T: 10/10/2008 07:30 PM  C#: [id number]    cc:   Cristal Generous, MD

## 2008-10-30 ENCOUNTER — Ambulatory Visit

## 2008-10-31 ENCOUNTER — Encounter

## 2008-12-12 ENCOUNTER — Ambulatory Visit

## 2009-01-03 ENCOUNTER — Ambulatory Visit

## 2009-02-26 ENCOUNTER — Ambulatory Visit

## 2009-03-08 ENCOUNTER — Encounter

## 2009-04-09 ENCOUNTER — Ambulatory Visit

## 2009-04-10 ENCOUNTER — Ambulatory Visit: Admitting: Dermatology

## 2009-04-10 VITALS — BP 130/80 | HR 76 | Resp 20

## 2009-04-10 NOTE — Progress Notes (Signed)
04/10/2009  Dear Dr. Cristal Generous, MD,  I had the pleasure of seeing your patient [name], a delightful 74yr male at your request in consultation for evaluation and management of skin lesions.  The patient's main concern is melanoma follow-up. The patient does not report any symptoms or suspicious lesions at this time. Past treatment for this problem includes melanoma removal in 2008.    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  Social History:  History   Substance Use Topics    Tobacco Use: Never    Alcohol Use: No     Allergies: Pcn (penicillin)  Medications: Cholecalciferol, Vitamin D3, (VITAMIN D) 1,000 unit PO Capsule, Take 2 Caps by mouth every day.  Zolpidem (AMBIEN) 10 mg PO Tablet, Take 1 Tab by mouth every day at bedtime if needed       Review of Systems: Pertinent positives are noted in the history of present illness (HPI) above. All other review of systems is negative.    Physical Examination/ Assessment/ Plan   On physical examination, the patient is in no apparent distress and breathing comfortably. Vital signs were noted. The patient is well nourished and pleasant. The patient is alert to time, place and person. The cutaneous areas of the head and face, neck, chest including the breast, axilla, back, abdomen, upper extremities,  buttocks, and lower extremities were examined. The genitalia was not examined. The examination of the above areas are within normal limits, except for any abnormalities are noted below.    History of  Melanoma in situ: status post excision 2008. Scar left upper back.  --no evidence of recurrence.  --lymph node examination within normal limits.    Benign-appearing nevi: Multiple tan to brown macules and papules with even border and color and benign pigment network under epiluminescence microscopy on the trunk, upper extremities, and lower extremities.   --Pt was encouraged to perform self-skin examination approximately once a month to observe any changes in these currently benign-appearing nevi.     Seborrheic Keratoses: Multiple brown, verrucous, stuck-on papules on the trunk and extremities.   --Benign nature of these skin lesions was explained to the patient. No treatment is indicated at this time. The patient has been educated about self examination, and if changes occur including change in size,color or if lesions become symptomatic, the patient was instructed to return for follow up.    Cherry angiomas: Scattered brightly erythematous, vascular papules on the trunk and extremities.   --Benign nature of  these skin lesions was explained to the patient. No treatment is indicated at this time. The patient has been educated about self examination, and if changes occur including change in size,color or if lesions become symptomatic, the patient was instructed to return for follow up.     Solar lentigines: Multiple tan to brown, reticulate macules with moth-eaten border on epiluminescence microscopy consistent with lentigines on the face, upper back, and arms.   --Wynelle Link protection education was reviewed with the patient, including using a sunscreen with broadspectrum protection with SPF of at least 30 and above, with frequent re-application.     Medication Review:   --We discussed risks, benefits, and proper use of  dermatological medications with the patient. Patient verbalizes understanding of the proper use of dermatological medications.     Education needs were identified. There were no barriers to understanding, and the patient's questions and concerns were addressed.     Thank you for allowing me to participate in the care of this patient.   We have asked Dustin Ibarra to return to clinic in 6 months or sooner as needed.   Patient was seen with medical student Cherylann Ratel, MSIII  April Costella Hatcher, MD  Attending Physician  Department of Dermatology  [place] [facility]

## 2009-04-10 NOTE — Nursing Note (Signed)
>>   Evalina Field, MA     Wed Apr 10, 2009  3:42 PM  vital signs taken, allergies verified, screened for pain, pharmacy verified.  Evalina Field, MA I

## 2009-04-24 ENCOUNTER — Other Ambulatory Visit: Payer: Self-pay | Admitting: FAMILY PRACTICE

## 2009-04-24 MED ORDER — ZOLPIDEM 10 MG TABLET
1.0000 | ORAL_TABLET | Freq: Every day | ORAL | Status: DC | PRN
Start: 2009-04-24 — End: 2009-10-28

## 2009-05-09 ENCOUNTER — Encounter: Payer: Self-pay | Admitting: FAMILY PRACTICE

## 2009-05-09 MED ORDER — FLUTICASONE PROPIONATE 50 MCG/ACTUATION NASAL SPRAY,SUSPENSION
1.0000 | Freq: Every day | NASAL | Status: AC
Start: 2009-05-09 — End: 2010-05-09

## 2009-05-09 NOTE — Telephone Encounter (Signed)
From: Tish Men   To: Cristal Generous, MD   Sent: Thu May 09, 2009 11:23 AM   Subject: Non-urgent Medical Advice Question    HI Dr Renette Butters,    I would like an RX for Flonase nasal spray.  My pharmacy number is [phone].    Thank you,    Tish Men

## 2009-06-14 ENCOUNTER — Emergency Department (HOSPITAL_BASED_OUTPATIENT_CLINIC_OR_DEPARTMENT_OTHER): Admission: EM | Admit: 2009-06-14 | Discharge: 2009-06-14 | Payer: Self-pay | Admitting: Emergency Medicine

## 2009-06-14 ENCOUNTER — Ambulatory Visit: Payer: Self-pay | Admitting: Radiology

## 2009-10-25 ENCOUNTER — Ambulatory Visit

## 2009-10-28 ENCOUNTER — Other Ambulatory Visit: Payer: Self-pay | Admitting: FAMILY PRACTICE

## 2009-10-28 MED ORDER — ZOLPIDEM 10 MG TABLET
1.0000 | ORAL_TABLET | Freq: Every day | ORAL | Status: DC | PRN
Start: 2009-10-28 — End: 2010-02-06

## 2009-12-05 ENCOUNTER — Ambulatory Visit

## 2009-12-12 ENCOUNTER — Encounter: Admitting: FAMILY PRACTICE

## 2010-01-01 ENCOUNTER — Telehealth: Payer: Self-pay | Admitting: FAMILY PRACTICE

## 2010-01-01 ENCOUNTER — Ambulatory Visit

## 2010-01-01 NOTE — Telephone Encounter (Signed)
Called and left msg for patient that appointment with Dr Riggle on 06/22 has been cancelled. Advised to call back and speak to operator and schedule appointment with next available MD.    Thank you  Jo Ena Christensen  [facility]

## 2010-01-20 ENCOUNTER — Ambulatory Visit

## 2010-01-20 ENCOUNTER — Encounter

## 2010-01-20 ENCOUNTER — Ambulatory Visit: Admitting: Family Medicine

## 2010-01-20 VITALS — BP 120/80 | HR 104 | Temp 99.5°F | Resp 12 | Wt 179.0 lb

## 2010-01-20 NOTE — Nursing Note (Signed)
>>   Dustin Ibarra     Mon Jan 20, 2010  2:52 PM  Vitals Taken. Dustin Ibarra, [facility]

## 2010-01-20 NOTE — Progress Notes (Signed)
SUBJECTIVE:  Dustin Ibarra is a 49yr old male  Patient Active Problem List   Diagnoses Code    PAIN IN LIMB 729.5    INSOMNIA UNSPECIFIED 780.52    OTHER DISEASES OF NASAL CAVITY AND SINUSES 478.19    OTHER AND UNSPECIFIED NONSPECIFIC IMMUNOLOGICAL FINDINGS 795.79    Melanoma in Situ 172.9BH    Sleep Apnea 780.57C    Low Vitamin D  268.9       Chief Complaint   Patient presents with    Fever    Dizziness    Headache       Fever x 5 days   Patient is here with fever to 104, fatigue, night sweats, polyarthralgias, loss of appetite and malaise.  He has been taking aspirin and nyquil with some relief.  Symptoms are mostly at night.  He denies cough, sore throat, no ear pain.  Positive headache.  Overall symptoms are a little better today.  Last aspirin was about 2.5 hours ago.     Review of Systems -   No nausea or vomiting or diarrhea, occ dizziness     Current outpatient prescriptions   Medication Sig    Cholecalciferol, Vitamin D3, (VITAMIN D) 1,000 unit PO Capsule Take 2 Caps by mouth every day.    Fluticasone (FLONASE) 50 mcg/Actuation nasal spray Instill 1 Spray into EACH nostril every day.    Zolpidem (AMBIEN) 10 mg Tablet Take 1 Tab by mouth every day at bedtime if needed.         OBJECTIVE:  Filed Vitals:    01/20/10 1449   BP: 120/80   Pulse: 104   Temp: 37.5 C (99.5 F)   TempSrc: Oral   Resp: 12   Weight: 81.194 kg (179 lb)         GENERAL: alert tired appearing white male in no acute distress  HEENT: EOMI, moist mucosa, TMs clear  LUNGS: clear to auscultation bilaterally, no crackles, no wheeze  HEART: regular rate and rhythm, no murmurs  ABD: soft nontender nondistended, positive bowel sounds            ASSESSMENT:/PLAN:  780.60BW Fever  (primary encounter diagnosis)  Comment: temp to 104, but resolves with aspirin, symptoms are overall a little better   Plan:  - check CBC AUTO + REFLEX MANUAL DIFF just due to degree of fever  - but reassurance provided, given that symptoms are better and  improve with medications   - call back in 5 days if fever not gone        Follow up prn       I did review patient's past medical and family/social history, no changes noted.  Barriers to Learning assessed: none. Patient verbalizes understanding of teaching and instructions.    This was electronically signed by  Orrin Brigham, MD  Network Physician  [facility]  Phone - [phone]

## 2010-01-22 NOTE — Telephone Encounter (Signed)
From: Tish Men   To: Cristal Generous, MD   Sent: Tue Jan 21, 2010 10:39 PM   Subject: Test Result Question    Dr. Tiajuana Amass, Thanks for the news about the low white blood count.  Unfortunately, I'm not getting better, and for me to be this sick is extremely rare. My fever has not fallen below 100 (most times it's 104+) no appetite and now I've developed Diarrhea and continue to completely soak my bed from sweating two/three times a night. This has gone on for 6 days now. I wonder if you could order an H1N1 test and write a note to [employer] so I don't have to work.  Thanks, Tish Men

## 2010-02-05 ENCOUNTER — Encounter: Admitting: FAMILY PRACTICE

## 2010-02-06 ENCOUNTER — Ambulatory Visit: Admitting: Family Medicine

## 2010-02-06 ENCOUNTER — Encounter: Payer: Self-pay | Admitting: Family Medicine

## 2010-02-06 VITALS — BP 116/68 | HR 70 | Temp 99.0°F | Resp 16 | Ht 68.25 in | Wt 170.4 lb

## 2010-02-06 MED ORDER — ZOLPIDEM 10 MG TABLET
1.0000 | ORAL_TABLET | Freq: Every day | ORAL | Status: DC | PRN
Start: 2010-02-06 — End: 2010-03-26

## 2010-02-06 NOTE — Progress Notes (Signed)
Dustin Ibarra is a(n) 49yr old male who presents today, 02/06/2010, for a complete physical exam.  He is exercising and dieting and has lost weight..  He has chronic foot pain on the right foot.  He has seen podiatry and orthopedics.  Patient wearing sunscreen regularly and gets his skin checked.        Past Surgical History   Procedure Date    Appendectomy 1968        reports that he has never smoked. He has never used smokeless tobacco.  He reports that he does not currently drink alcohol or use illicit drugs.  family history includes Cancer in his mother; Heart in his mother; Hypertension in his father; and Stroke in his father.      Pcn (Penicillin) (Penicillins)    Hives    Comment:Given shot for reaction.    CURRENT MEDICATIONS:  Outpatient prescriptions marked as taking for the 02/06/10 encounter (Office Visit) with Sherrin Daisy C   Medication Sig Dispense Refill    Cholecalciferol, Vitamin D3, (VITAMIN D) 1,000 unit PO Capsule Take 2 Caps by mouth every day.  60 Cap  12    Fluticasone (FLONASE) 50 mcg/Actuation nasal spray Instill 1 Spray into EACH nostril every day.  16 g  12    Zolpidem (AMBIEN) 10 mg Tablet Take 1 Tab by mouth every day at bedtime if needed.  20 Tab  3         Patient Active Problem List   Diagnoses Code    PAIN IN LIMB 729.5    INSOMNIA UNSPECIFIED 780.52    OTHER DISEASES OF NASAL CAVITY AND SINUSES 478.19    OTHER AND UNSPECIFIED NONSPECIFIC IMMUNOLOGICAL FINDINGS 795.79    Melanoma in Situ 172.9BH    Sleep Apnea 780.57C    Low Vitamin D  268.9        PHYSICAL EXAM:   BP 116/68  Pulse 70  Temp(Src) 37.2 C (99 F) (Oral)  Resp 16  Ht 1.734 m (5' 8.25")  Wt 77.293 kg (170 lb 6.4 oz)  BMI 25.72 kg/m2  General:  --no jaundice  --no respiratory distress  --does not appear acutely ill  Ears:  --TMs and canals: normal  Oropharynx--no obvious tooth or gum problems                     --no pharyngeal inflammation  Face:--no skin rashes  Nose: No external  abnormalities  Neck:  --Supple  --no thyromegaly  --no anterior cervical adenopathy, no supraclavicular adenopathy  Lungs:  --Lungs are clear to ausculation  Card:  --RRR without murmur  ABDOMEN:  BS + and normal in frequency  --No hepatosplenomegaly  Murphy's sign: negative  --no umbilical hernia  No masses  --nontender  GENITALIA:  --penis normal without drip  --testes normal without masses  Rectal exam  --prostate normal without masses  --no rectal masses  --stool is heme neg  EXTREMITIES  --no clubbing, cyanosis, or edema  Musculoskeletal:  --back without deformities.  Enlarged 1st mtp  Skin  --no suspicious lesions  PULSES:  --normal dorsalis pedis and post tib pulses bilaterally  NEURO  --DTRs symmetric patellar and achilles  --affect is appropriate    ASSESSMENT & PLAN:  1. HCM--check labs    2. Low vit d--check labs    3.  Insomnia--the risks of habituation discussed.  Ambien refilled.  Meditation discussed    Barriers to Learning assessed: none. Patient verbalizes understanding of teaching and instructions.

## 2010-02-06 NOTE — Nursing Note (Signed)
>>   Dustin Ibarra     Thu Feb 06, 2010 10:06 AM  Vital signs taken, allergies verified, screened for pain.  Littie Deeds, MA I

## 2010-02-27 ENCOUNTER — Ambulatory Visit

## 2010-02-27 ENCOUNTER — Encounter

## 2010-02-27 LAB — LIPID PANEL WITH DLDL REFLEX

## 2010-03-26 ENCOUNTER — Other Ambulatory Visit: Payer: Self-pay | Admitting: FAMILY PRACTICE

## 2010-03-26 MED ORDER — ZOLPIDEM 10 MG TABLET
1.0000 | ORAL_TABLET | Freq: Every day | ORAL | Status: DC | PRN
Start: 2010-03-26 — End: 2010-06-03

## 2010-05-30 ENCOUNTER — Ambulatory Visit

## 2010-05-30 NOTE — Nursing Note (Signed)
>>   Madelon Lips, LVN     Fri May 30, 2010 11:44 AM  The Inactivated Influenza Vaccine VIS document was given to patient to review and any questions were referred to the physician. The Influenza Vaccine was then administered per protocol. The patient was observed for immediate reactions to the vaccine per protocol. None were observed.   Madelon Lips, LVN

## 2010-06-03 ENCOUNTER — Other Ambulatory Visit: Payer: Self-pay | Admitting: FAMILY PRACTICE

## 2010-06-03 MED ORDER — ZOLPIDEM 10 MG TABLET
1.0000 | ORAL_TABLET | Freq: Every day | ORAL | Status: DC | PRN
Start: 2010-06-03 — End: 2010-07-07

## 2010-06-03 MED ORDER — FLUTICASONE PROPIONATE 50 MCG/ACTUATION NASAL SPRAY,SUSPENSION
1.0000 | Freq: Every day | NASAL | Status: DC
Start: 2010-06-03 — End: 2011-06-25

## 2010-07-07 ENCOUNTER — Other Ambulatory Visit: Payer: Self-pay | Admitting: FAMILY PRACTICE

## 2010-07-07 MED ORDER — ZOLPIDEM 10 MG TABLET
1.0000 | ORAL_TABLET | Freq: Every day | ORAL | Status: DC | PRN
Start: 2010-07-07 — End: 2010-08-29
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
## 2010-08-29 ENCOUNTER — Other Ambulatory Visit: Payer: Self-pay | Admitting: FAMILY PRACTICE

## 2010-08-29 MED ORDER — ZOLPIDEM 10 MG TABLET
1.0000 | ORAL_TABLET | Freq: Every day | ORAL | Status: DC | PRN
Start: 2010-08-29 — End: 2010-09-21

## 2010-09-21 ENCOUNTER — Other Ambulatory Visit: Payer: Self-pay | Admitting: FAMILY PRACTICE

## 2010-09-21 MED ORDER — ZOLPIDEM 10 MG TABLET
1.0000 | ORAL_TABLET | Freq: Every day | ORAL | Status: DC | PRN
Start: 2010-09-21 — End: 2010-10-27

## 2010-10-27 ENCOUNTER — Ambulatory Visit: Admitting: FAMILY PRACTICE

## 2010-10-27 ENCOUNTER — Telehealth: Payer: Self-pay | Admitting: Family Medicine

## 2010-10-27 ENCOUNTER — Encounter: Payer: Self-pay | Admitting: FAMILY PRACTICE

## 2010-10-27 ENCOUNTER — Ambulatory Visit

## 2010-10-27 VITALS — BP 120/72 | HR 102 | Temp 98.8°F | Resp 16

## 2010-10-27 MED ORDER — AZITHROMYCIN 250 MG TABLET
ORAL_TABLET | ORAL | Status: DC
Start: 2010-10-27 — End: 2010-10-27

## 2010-10-27 MED ORDER — AZITHROMYCIN 250 MG TABLET
ORAL_TABLET | ORAL | Status: AC
Start: 2010-10-27 — End: 2010-11-01

## 2010-10-27 MED ORDER — ZOLPIDEM 10 MG TABLET
1.0000 | ORAL_TABLET | Freq: Every day | ORAL | Status: DC | PRN
Start: 2010-10-27 — End: 2010-12-24

## 2010-10-27 NOTE — Progress Notes (Signed)
Chief Complaint:   Chief Complaint   Patient presents with    Cough    Fever     Subjective: Dustin Ibarra is a 50yr old male who presents for further evaluation of respiratory tract infection.   He's been sick for about 8 days.  She notes nasal congestion, cough, chest tightness, and fever up to 100 degrees. He is tired and short of breath. He is concerned about pneumonia.  He has a history of obstructive sleep apnea. He is not using CPAP. He is interested and repeats sleep study.  He is a history of insomnia. He takes Ambien intermittently. He suspects his frequent travel has a lot to do with his insomnia.    History: I did review patient's past medical and family/social history.  Medications reviewed and updated (see medication list)    ROS:He denies nausea, vomiting, abdominal pain, change in bowel or bladder pattern.    OBJECTIVE: Blood pressure 120/72, pulse 102, temperature 37.1 C (98.8 F), temperature source Oral, resp. rate 16, SpO2 95.00%.  In general, he appears comfortable. Sclera anicteric. Conjunctiva pink. Nares patent. Oropharynx moist. Neck veins flat. Lungs show crackles at the left base. Heart regular rate and rhythm. Abdomen is soft, nontender. Extremities warm without cyanosis, or edema.         ASSESSMENT & PLAN:     780.52 INSOMNIA UNSPECIFIED  (primary encounter diagnosis)  Comment:  Chronic.  Plan: Zolpidem (AMBIEN) 10 mg Tablet        Education regarding sleep hygiene.    486 Pneumonia  Comment:  Community acquired. Left lower lobe.  Plan: Azithromycin (ZITHROMAX Z-PAK) 250 mg Tablet        Excuse him from work for the next week.  Hydration. Further evaluation if symptoms worsen.    172.9 Melanoma in situ  Comment:  Status post excision.  Plan:  Dermatology followup.    780.57 Sleep apnea  Comment: nontreated.  Plan: SLEEP STUDY        Further evaluation after sleep study.           Barriers to Learning assessed: none. Patient verbalizes understanding of teaching and instructions.    Cristal Generous, MD

## 2010-10-27 NOTE — Patient Instructions (Addendum)
An electronic referral has been submitted to [facility] for further evaluation and treatment of your problem. You should receive an approval notice in the mail within 2 weeks. After the approval notice has been received, or if a notice is not received within two weeks, you may call [phone] or [phone] to schedule your appointment.      BMJ Open 2012;2:e000850 doi:10.1136/bmjopen-2012-000850      Pharmacology and therapeutics  Hypnotics' association with mortality or cancer: a matched cohort study  OPEN ACCESS  . Monica Becton, Violet Baldy  +  Author Affiliations  . 1[facility], [place], [place], [place]  . [facility], [place], [place], [place]  . Correspondence to?Dr Dory Horn; [email or web address]      Received 25 August 2010      Accepted 05 September 2010      Published 13 October 2010  Abstract  Objectives An estimated 6%-10% of [place] adults took a hypnotic drug for poor sleep in 2010. This study extends previous reports associating hypnotics with excess mortality.  Setting A large integrated health system in the [place].  Design Longitudinal electronic medical records were extracted for a one-to-two matched cohort survival analysis.  Subjects Subjects (mean age 8?years) were 10?529 patients who received hypnotic prescriptions and 23?676 matched controls with no hypnotic prescriptions, followed for an average of 2.5?years between January 2002 and January 2007.  Main outcome measures Data were adjusted for age, gender, smoking, body mass index, ethnicity, marital status, alcohol use and prior cancer. Hazard ratios (HRs) for death were computed from Cox proportional hazards models controlled for risk factors and using up to 116 strata, which exactly matched cases and controls by 12 classes of comorbidity.  Results As predicted, patients prescribed any hypnotic had substantially elevated hazards of  dying compared to those prescribed no hypnotics. For groups prescribed 0.4-18, 18-132 and >132 doses/year, HRs (95% CIs) were 3.60 (2.92 to 4.44), 4.43 (3.67 to 5.36) and 5.32 (4.50 to 6.30), respectively, demonstrating a dose-response association. HRs were elevated in separate analyses for several common hypnotics, including zolpidem, temazepam, eszopiclone, zaleplon, other benzodiazepines, barbiturates and sedative antihistamines. Hypnotic use in the upper third was associated with a significant elevation of incident cancer; HR=1.35 (95% CI 1.18 to 1.55). Results were robust within groups suffering each comorbidity, indicating that the death and cancer hazards associated with hypnotic drugs were not attributable to pre-existing disease.  Conclusions Receiving hypnotic prescriptions was associated with greater than threefold increased hazards of death even when prescribed <18 pills/year. This association held in separate analyses for several commonly used hypnotics and for newer shorter-acting drugs. Control of selective prescription of hypnotics for patients in poor health did not explain the observed excess mortality.

## 2010-10-27 NOTE — Telephone Encounter (Signed)
Call from Pharmacy regarding no Rx sent       486 Pneumonia  (primary encounter diagnosis)  Comment:   Plan: Azithromycin (ZITHROMAX Z-PAK) 250 mg Tablet

## 2010-10-27 NOTE — Nursing Note (Signed)
>>   ANDREA MAYES     Mon Oct 27, 2010  2:45 PM  Vitals Taken. [name], [facility]

## 2010-11-03 ENCOUNTER — Ambulatory Visit

## 2010-12-19 ENCOUNTER — Ambulatory Visit

## 2010-12-22 ENCOUNTER — Ambulatory Visit: Admitting: Dermatology

## 2010-12-24 ENCOUNTER — Other Ambulatory Visit: Payer: Self-pay | Admitting: FAMILY PRACTICE

## 2010-12-24 MED ORDER — ZOLPIDEM 10 MG TABLET
1.0000 | ORAL_TABLET | Freq: Every day | ORAL | Status: DC | PRN
Start: 2010-12-24 — End: 2010-12-25

## 2010-12-24 NOTE — Telephone Encounter (Signed)
Patient called and stated that he is leaving town and will need a refill on his medication tonight. Please call if any questions.    Thank you  Carrington Clamp  [facility]

## 2010-12-25 ENCOUNTER — Other Ambulatory Visit: Payer: Self-pay | Admitting: FAMILY PRACTICE

## 2010-12-25 MED ORDER — ZOLPIDEM 10 MG TABLET
1.0000 | ORAL_TABLET | Freq: Every day | ORAL | Status: DC | PRN
Start: 2010-12-25 — End: 2011-02-25

## 2011-02-10 ENCOUNTER — Encounter: Payer: Self-pay | Admitting: FAMILY PRACTICE

## 2011-02-25 ENCOUNTER — Other Ambulatory Visit: Payer: Self-pay | Admitting: FAMILY PRACTICE

## 2011-02-25 MED ORDER — ZOLPIDEM 10 MG TABLET
1.0000 | ORAL_TABLET | Freq: Every day | ORAL | Status: DC | PRN
Start: 2011-02-25 — End: 2011-04-01

## 2011-03-10 ENCOUNTER — Ambulatory Visit

## 2011-03-31 ENCOUNTER — Ambulatory Visit

## 2011-03-31 ENCOUNTER — Telehealth: Payer: Self-pay | Admitting: FAMILY PRACTICE

## 2011-03-31 NOTE — Telephone Encounter (Signed)
Just to clarify, patient cannot be here at 2pm, tell him to be here at 440pm?  Littie Deeds, MAI

## 2011-03-31 NOTE — Telephone Encounter (Signed)
Please move patient to the 4:40 time slot and move the patient already scheduled at 4:40 PM to the 2:00 time slot.

## 2011-03-31 NOTE — Telephone Encounter (Signed)
4:40 pm patient will come in at 2pm.  Attempted to notify Dustin Ibarra to come in at 4:40 pm but his voice mail box is full and not accepting messages.  Please notify him when he calls back.

## 2011-03-31 NOTE — Telephone Encounter (Signed)
Please accommodate patient's request.  I suggest moving patient to 4:40 PM time slot and ask the patient scheduled at 4:40 PM to be seen at 2:00 PM.

## 2011-03-31 NOTE — Telephone Encounter (Signed)
Patient informed and confirmed.    No call back needed

## 2011-03-31 NOTE — Telephone Encounter (Signed)
Patient called stating that he had an appointment tomorrow with Dr. Renette Butters at 2 pm. States he is on a train and it is running behind schedule. Requesting to see if there is anyway Dr. Renette Butters can see him later than 2 pm tomorrow.    Please advise.    Thank you,    Symantha Steeber  [facility]

## 2011-04-01 ENCOUNTER — Ambulatory Visit: Admitting: FAMILY PRACTICE

## 2011-04-01 ENCOUNTER — Encounter: Payer: Self-pay | Admitting: FAMILY PRACTICE

## 2011-04-01 MED ORDER — ZOLPIDEM 10 MG TABLET
1.0000 | ORAL_TABLET | Freq: Every day | ORAL | Status: DC | PRN
Start: 2011-04-01 — End: 2011-05-21

## 2011-04-01 NOTE — Nursing Note (Signed)
>>   ANDREA MAYES     Wed Apr 01, 2011  4:37 PM  Vitals Taken. [name], [facility]

## 2011-04-01 NOTE — Patient Instructions (Signed)
New lab tests have been ordered. Please do these tests after a 12 hour fast (nothing to eat or drink except water) in 1-4 weeks. The lab tests have been ordered through the computer, no paper lab slip is needed. The lab is open from 8:00 AM to 4:15 Monday to Friday, and an appointment is needed at the lab. The lab is located on the first floor of the clinic building in the waiting area for [address].

## 2011-04-01 NOTE — Progress Notes (Signed)
Chief Complaint:   Chief Complaint   Patient presents with    Other     f/u     Subjective: Dustin Ibarra is a 50yr old male  who presents for followup.   Overall he is doing reasonably well.  He continues to have trouble with insomnia. He requests a refill of Ambien.  He is due for annual physical.  He is scheduled to see the dermatologist in the near future given his history of melanoma in situ.  He would like to review recent sleep study.    History: I did review patient's past medical and family/social history.  Medications reviewed and updated (see medication list)    ROS: No fever, chills, cough, congestion, change in bowel or bladder pattern.    OBJECTIVE: Blood pressure 122/80, pulse 92, resp. rate 12, weight 81.194 kg (179 lb).  In general, he looks well. Lungs clear. Heart regular rate and rhythm. Abdomen is soft, nontender. Extremities warm. There is no cyanosis, or edema.    Sleep study reviewed.    ASSESSMENT & PLAN:     786.09 Snorings  Comment: Mild sleep apnea.  Plan:  Avoid supine position. Consider night guard. Consider CPAP titration. If symptoms worsen.    780.52 INSOMNIA UNSPECIFIED  Plan: Zolpidem (AMBIEN) 10 mg Tablet         Sleep hygiene.      268.9 Low Vitamin D   Plan: VITAMIN D, 25 HYDROXY         Continue supplemental vitamin D.    172.9 Melanoma in situ  Plan: CBC AUTO + REFLEX MANUAL DIFF, COMPREHENSIVE         METABOLIC PANEL         Dermatology followup.    V82.89 Special screening for other specified conditions  Comment:  Hyperlipidemia.  Plan: LIPID PANEL WITH DLDL REFLEX                    Barriers to Learning assessed: none. Patient verbalizes understanding of teaching and instructions.    Cristal Generous, MD, MD

## 2011-04-02 ENCOUNTER — Telehealth: Payer: Self-pay | Admitting: FAMILY PRACTICE

## 2011-04-02 NOTE — Telephone Encounter (Signed)
rx at front desk. Pt notified with voice message.

## 2011-04-02 NOTE — Telephone Encounter (Signed)
New prescription completed.

## 2011-04-02 NOTE — Telephone Encounter (Signed)
Patient states [facility] will NOT accept the prescription for [facility] that Dr. Renette Butters hand wrote for him for a 30 day supply.     Patient states "Dr. Renette Butters needs to write a prescription for 15 day supply for [facility] that will be covered by insurance and another 15 day supply of AMBIEN that he plans to pay for out of his pocket." Patient states "this needs to be written on a secured prescription by 5:00pm today."    Please notify patient when this has been completed.

## 2011-04-08 ENCOUNTER — Ambulatory Visit

## 2011-04-09 ENCOUNTER — Encounter: Payer: Self-pay | Admitting: FAMILY PRACTICE

## 2011-04-09 NOTE — Telephone Encounter (Signed)
From: Tish Men  To: Cristal Generous, MD  Sent: 04/09/2011 3:20 PM PDT  Subject: Non-urgent Medical Advice Question    Dear Dr. Renette Butters,    Yes, I have not been taking my vitamin D as prescribed. I will begin taking it regularly now and am concerned about the really low number (not even in the 20's like before.)  I will schedule a physical w/ you ASAP.    Thank you,  Tish Men

## 2011-04-30 ENCOUNTER — Ambulatory Visit

## 2011-05-04 ENCOUNTER — Ambulatory Visit: Admitting: Dermatology

## 2011-05-13 ENCOUNTER — Ambulatory Visit: Admitting: Dermatology

## 2011-05-13 VITALS — BP 126/80 | HR 64 | Resp 16

## 2011-05-13 NOTE — Nursing Note (Signed)
>>   JENNIFER ALDRIDGE-GRANT     Wed May 13, 2011  2:06 PM  Vitals taken, allergies review, and  medication checked   Victorino Dike Aldridge-Grant sr.LVN

## 2011-05-13 NOTE — Progress Notes (Signed)
This patient was seen, evaluated, and care plan was developed with the resident. I agree with the findings and plan as outlined in the resident's note above.     Guenther Dunshee Wang Jabreel Chimento, MD MPH  Attending Physician  Department of Dermatology   [facility]

## 2011-05-13 NOTE — Progress Notes (Signed)
[name] is a delightful 71yr male seen for follow-up of skin lesions. 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 The patient denies biopsy site changes or changes in moles. The patient is asymptomatic.    [Elements in the HPI above include primary problem, its associated location, duration, symptoms, quality (mild/mod/severe) / course (constant, intermittent, worsening, improving) of symptoms, as well as any additional problems the patient is presenting at this visit.]    Past Medical History:  Past Medical History:    Pain in limb                                    10/18/2006      Insomnia, unspecified                           10/18/2006      Other diseases of nasal cavity and sinuses      10/18/2006      Varicella without mention of complication                   Family History:   family history includes Cancer in his mother; Heart in his mother; Hypertension in his father; and Stroke in his father.  Social History:  History   Substance Use Topics    Smoking status: Never Smoker     Smokeless tobacco: Never Used    Alcohol Use: No     Allergies: Pcn (penicillin)  Medications: Cholecalciferol, Vitamin D3, (VITAMIN D) 1,000 unit PO Capsule, Take 2 Caps by mouth every day.  Fluticasone (FLONASE) 50 mcg/Actuation nasal spray, Instill 1 Spray into EACH nostril every day.  Zolpidem (AMBIEN) 10 mg Tablet, Take 1 tablet by mouth every day at bedtime if needed.    Review of Systems: Pertinent positives are noted in the history of present illness (HPI) above. All other review of systems is negative.    Physical Examination/ Assessment/ Plan   On physical examination, the patient is in no apparent distress and breathing comfortably. Vital signs were noted. The patient is well nourished and pleasant. The patient is alert to time, place and person. The cervical, axillary, supraclavicular and inguinal  lymph nodes are all less than 1.0cm and there were are hard or immobile structures. The cutaneous areas of the head and face, neck, chest including the breast, axilla, back, abdomen, upper extremities, buttocks, and lower extremities were examined. The genitalia was not examined. The examination of the above areas are within normal limits, except for any abnormalities are noted below.    Skin exam in the setting of a prior melanoma-in situ:  --Well-healed scar of the left posterior shoulder/back.    Benign-appearing nevi: Multiple tan to brown macules and papules with even border and color and benign pigment network under epiluminescence microscopy on the trunk, upper extremities, and lower extremities.   --Pt. was encouraged to perform self-skin examination approximately once a month to observe any changes in these currently benign-appearing nevi.     Seborrheic keratoses, irritated:Total of two brown, verrucous, stuck on papules are present. One is located immediately superior to the left eyebrow and one is located on the anterior aspect of the right chest.   --After obtaining verbal consent, two lesions were treated with liquid nitrogen x 1. The  risks, including pain, blister formation, infection, hypo/hyperpigmentation, scar, recurrence, and the need for further treatment were discussed with the patient and informed consent was obtained verbally.    Cherry angiomas: Scattered brightly erythematous, vascular papules on the trunk and extremities.   --Benign nature of these skin lesions was explained to the patient. No treatment is indicated at this time. The patient has been educated about self examination, and if changes occur including change in size,color or if lesions become symptomatic, the patient was instructed to return for follow up.     Solar lentigines: Multiple tan to brown, reticulate macules with moth-eaten border on epiluminescence microscopy consistent with lentigines on the face, upper back, and  arms.   --Wynelle Link protection education was reviewed with the patient, including using a sunscreen with broadspectrum protection with SPF of at least 30 and above, with frequent re-application.     Medication Review:   --We discussed risks, benefits, and proper use of dermatological medications with the patient. Patient verbalizes understanding of the proper use of dermatological medications.   Education needs were identified. There were no barriers to understanding, and the patient's questions and concerns were addressed.   Thank you for allowing me to participate in the care of this patient.   We have asked [name] to return to clinic in 12 months or sooner as needed.     Patient was seen with Dr. Heide Spark    April Costella Hatcher, MD, MPH  Attending Physician  Department of Dermatology  [facility]

## 2011-05-21 ENCOUNTER — Ambulatory Visit

## 2011-05-21 ENCOUNTER — Other Ambulatory Visit: Payer: Self-pay | Admitting: FAMILY PRACTICE

## 2011-05-21 MED ORDER — ZOLPIDEM 10 MG TABLET
1.0000 | ORAL_TABLET | Freq: Every day | ORAL | Status: DC | PRN
Start: 2011-05-21 — End: 2011-07-24

## 2011-05-21 NOTE — Telephone Encounter (Signed)
Refilled medication.  Please notify the patient.

## 2011-05-21 NOTE — Progress Notes (Signed)
The Inactivated Influenza Vaccine VIS document was given to patient to review and any questions were referred to the physician. The Influenza Vaccine was then administered per protocol. The patient was observed for immediate  reactions to the vaccine per protocol. None were observed.  Kavi Almquist, MA

## 2011-05-21 NOTE — Telephone Encounter (Signed)
Patient needs refill of Ambien. He is switching pharmacy's to [facility] in [place]. He is out of medication and would like this filled today please  Thank you,  Amy Stilwell, [facility]

## 2011-05-22 ENCOUNTER — Other Ambulatory Visit: Payer: Self-pay | Admitting: FAMILY PRACTICE

## 2011-05-22 NOTE — Telephone Encounter (Signed)
Pt notified with voice message.

## 2011-05-22 NOTE — Telephone Encounter (Signed)
Message left on recorder to call back to verify which pharmacy he wants Zolpidem filled at.  On 10/4 refill was sent to [facility], received [facility]. Wynona Luna, LVN

## 2011-06-25 ENCOUNTER — Ambulatory Visit: Admitting: FAMILY PRACTICE

## 2011-06-25 ENCOUNTER — Encounter: Payer: Self-pay | Admitting: FAMILY PRACTICE

## 2011-06-25 ENCOUNTER — Ambulatory Visit

## 2011-06-25 VITALS — BP 112/80 | HR 76 | Resp 12 | Ht 68.5 in | Wt 183.0 lb

## 2011-06-25 MED ORDER — FLUTICASONE PROPIONATE 50 MCG/ACTUATION NASAL SPRAY,SUSPENSION
1.0000 | Freq: Every day | NASAL | Status: DC
Start: 2011-06-25 — End: 2013-09-18

## 2011-06-25 NOTE — Progress Notes (Signed)
Dustin Ibarra  presents for preventive care.  His last physical was 1 year(s) ago. In general he has been feeling well and functioning well at home, work, and personal relationships.  He had the opportunity to meet with a dermatologist given his history of melanoma in situ.  He is compliant with sleep hygiene and uses Ambien after layovers.  Nasal symptoms are stable.    Prostate exams have been normal.     Immunization History   Administered Date(s) Administered    Influenza Vaccine (Fluarix) 06/13/2008    Influenza Vaccine (Fluzone/fluarix Prefill Syringe) 05/30/2010, 05/21/2011    Tdap (Adacel ) 10/18/2006       Family history is significant for unchanged    Sexual History: is heterosexual    Social history: I  did review and update substance use history form.  His routine exercise includes 30 minutes of aerobic activity 4 days per week, and his diet is low fat/low cholesterol.   CAGE questions positive for no to all    Safety issues:  The patient does routinely use seatbelts.  He does  have smoke detectors, with biannual power checks, in his house.  He does not have guns in the house.     ROS:   Constitutional: negative.  Eyes: negative.  Ears, Nose, Mouth, Throat: negative.  CV: negative.  Resp: negative.  GI: negative.  GU: negative.  Musculoskeletal: negative.  Integumentary: negative.  Neuro: negative.  Psych: negative.  Endo: negative.  Heme/Lymphatic: negative.  Allergy/Immun: negative.        Exam:  Vital signs are stable he is afebrile (see vitals)  In general he looks well.  He is awake alert oriented and doesn't appear ill..  Affect appropriate.  Sclera anicteric.  Conjunctiva pink.  Pupils equal round react to light and accommodation.  Extraocular muscles full.  Nares patent.  Oropharynx moist.  Dentition good.  Tympanic membranes normal.  Neck nontender.  No thyromegaly.  Lungs clear.  Heart regular rate and rhythm.  Abdomen is soft nontender.  Extremities are warm without cyanosis or edema.   Genitourinary normal male genitalia.  Testicles descended bilaterally.  Rectal reveals slightly enlarged prostate without nodules.    Labs reviewed.    ASSESSMENT/PLAN:  1. Routine general medical examination at a health care facility   Plan: Nutritional counseling.  Exercise counseling.  I provided information on prostate cancer.  Immunizations are up-to-date.  Followup 1 year time.       2. Other diseases of nasal cavity and sinuses   Plan: Maintain nasal steroid.       3. Insomnia   Plan: Reviewed sleep hygiene.  Cautious use of Ambien.       4. Melanoma in situ   Plan: Followup with dermatology.       5. Low Vitamin D    Plan: Continue supplemental vitamin D.  Repeat vitamin D level.       6. Sleep apnea   Plan: Pursue oral appliance.         Barriers to Learning: none. Patient/Family Understanding: verbalizes.    Sofie Rower, M.D.

## 2011-06-25 NOTE — Nursing Note (Signed)
>>   Dustin Ibarra     Thu Jun 25, 2011 10:21 AM  Vitals Taken. Dustin Ibarra, [facility]

## 2011-06-25 NOTE — Patient Instructions (Signed)
New lab tests have been ordered. Please do these tests in 12 weeks.  Fasting is NOT necessary.  The lab is open from 8:00 AM to 4:15 Monday to Friday and an appointment is needed.  No paper work is required as the orders exist in the electronic medical record. The lab is located on the first floor of the clinic building in the waiting area for [address].      - - - - - - - - - - - - - - - Information on Prostate Cancer - - - - - - - - - - - - - - -    The prostate is a gland, about the size of a walnut, located under the bladder and surrounding the upper part of the urethra. The urethra is a tube that carries urine and semen through the penis to the outside of the body. The prostate gland is found only in men. It produces semen, the liquid that carries the sperm when a man ejaculates.    Prostate cancer starts in the cells of the prostate gland. When a man has prostate cancer, the prostate cancer cells grow into a tumor. These cancer cells can also spread into other parts of the body.    The most common symptom of prostate cancer is no symptom at all.     Risk Factors and Prevention:    While 1 man out of 6 will be diagnosed with prostate cancer during his lifetime, only 1 man in 34 will die of this disease. The death rate for prostate cancer is going down. And the disease is being found earlier as well.     Prostate cancer is a disease that is often associated with aging. About 75 percent of men are aged 65 years and older when diagnosed with prostate cancer.     African American men are at especially high risk for prostate cancer: They are over 50 percent more likely to develop this disease than non-Hispanic white men. African American men are twice as likely to die of this disease. Prostate cancer occurs less often in Asian men than in whites.    Men with close family members (father or brother) who have had prostate cancer are more likely to get it themselves, especially if their relatives were young when they got  the disease.     In [place], approximately 18,865 men will be diagnosed with prostate cancer in 2007, 3,010 men will die from it and 114,600 men will continue living with it.     Because we don't know the exact cause of prostate cancer, it is not possible to give good advice about prevention. Some experts think that diet can reduce the risk of developing prostate cancer by eating less red meat and fat, and eating more fruits, vegetables and grains.    Prostate Cancer Screening Options:    Prostate cancer can often be found early by testing the amount of Prostate-Specific Antigen (PSA) in a man's blood. PSA is a substance made by the normal prostate gland. Although PSA is mostly found in semen, a small amount is also found in the blood. There is no question that the PSA test can help spot prostate cancer. But it can't tell how dangerous the cancer is.    Another way to find prostate cancer is the Digital Rectal Exam (DRE). To do the DRE, the doctor inserts a gloved, lubricated finger into the rectum to feel for any irregular or firm areas that might   be cancer. The DRE is less effective than the PSA blood test in finding prostate cancer, but it can sometimes find cancers in men with normal PSA levels. For this reason, the American Cancer Society (ACS) recommends that when prostate cancer screening is done, both the DRE and the PSA should be used.     These tests are not always accurate, however. Wrong test results could lead to excess worry, or even an unneeded biopsy or other tests. Until more is known, one should talk to his doctor about whether or not he needs to be tested. Things to take into account are age and health. If one is young and gets prostate cancer, it will probably shorten his life if it is not caught early. But if one is older or in poor health, then prostate cancer may never become a major problem because it often grows so slowly.     ACS believes that doctors should offer the PSA blood test and  DRE yearly, beginning at age 50 to men who do not have any major medical problems and can be expected to live at least 10 more years. Men at high risk should begin testing at age 45. Men at high risk include African Americans and men who have a close relative who had prostate cancer before age 65. Men at even higher risk (have several close relatives with prostate cancer at an early age) could begin testing at age 40.     If certain symptoms or the results of early tests suggest one might have prostate cancer, the doctor will use further tests to find out whether the disease is present.    The prostate biopsy is the only way to know for sure if a man has prostate cancer. During a biopsy, tissue from a man's prostate is removed so it can be sent to the lab to see if there are cancer cells. A core needle biopsy is the main method used.    Cancer Treatment:    Most of the time, prostate cancer grows slowly. Autopsy studies show that many older men who died of other diseases also had prostate cancer that neither they nor their doctor were aware of. But sometimes prostate cancer can grow and spread quickly. Even with the latest methods, it is hard to tell which prostate cancers will grow slowly and which will grow quickly.     If cancer is present, the biopsy sample will be graded. Grading the cancer helps to predict how fast the cancer is likely to grow and spread. A staging system is a way to describe the extent to which the cancer has spread.    Surgery, radiation, and hormone therapy are the most common treatments for prostate cancer. Chemotherapy may be used in some cases, and watchful waiting, though not an active form of treatment, may also be used.    There are risks and significant side effects with all forms of treatment for prostate cancer. They include impotence, sterility, and incontinence.    Several factors should be taken into account before one chooses a course of treatment. These factors include age,  overall health, goals for treatment, and one's feelings about side effects, risks and complications of treatment.. Some men, for example, can't imagine living with side effects such as incontinence or impotence. Others are less concerned about these and more concerned about getting rid of the cancer.  It is often helpful to discuss treatment options with more than one doctor. Many men find that talking   to others who have faced the same issues is helpful.    Prostate Cancer Survival Rates:    Overall, 99 percent of men diagnosed with prostate cancer survive at least 5 years. Ninety one percent of all prostate cancers are found while they are still within the prostate or only in nearby areas. Nearly 100 percent of these men survive at least 5 years. For the men whose cancer has already spread to distant parts of the body when it is found, 34 percent will survive at least 5 years.    Sources:  1. American Cancer Society, Overview: Prostate Cancer. Available @ www.cancer.org. Accessed:  January 2007  2. IMPACT, What Every Man Should Know About Prostate Cancer? Brochure.  Available @ http://www.[place]-impact.org/public/genlbro.pdf . Accessed: January 2007.  3. American Cancer Society, [place] Division, and Public health Institute, [place] Cancer  Registry, [place] Cancer Facts & Figures 2007, [place], [place]. September 2006

## 2011-07-22 ENCOUNTER — Encounter: Payer: Self-pay | Admitting: FAMILY PRACTICE

## 2011-07-22 NOTE — Telephone Encounter (Signed)
From: Tish Men  To: Cristal Generous, MD  Sent: 07/22/2011 9:15 AM PST  Subject: MyChart Refill Request    Hi Dr Renette Butters,    I need my Ambien refilled, please. Two separate RX's for 15, and then I pay for one.   [facility] [address] [phone]    Thanks and [name]!    Dustin Ibarra

## 2011-07-24 MED ORDER — ZOLPIDEM 10 MG TABLET
1.0000 | ORAL_TABLET | Freq: Every day | ORAL | Status: DC | PRN
Start: 2011-07-24 — End: 2011-07-24

## 2011-07-24 MED ORDER — ZOLPIDEM 10 MG TABLET
1.0000 | ORAL_TABLET | Freq: Every day | ORAL | Status: DC | PRN
Start: 2011-07-24 — End: 2011-08-04

## 2011-07-24 NOTE — Telephone Encounter (Signed)
Please fax

## 2011-08-04 ENCOUNTER — Other Ambulatory Visit: Payer: Self-pay | Admitting: FAMILY PRACTICE

## 2011-08-04 MED ORDER — ZOLPIDEM 10 MG TABLET
1.0000 | ORAL_TABLET | Freq: Every day | ORAL | Status: DC | PRN
Start: 2011-08-04 — End: 2013-09-18

## 2011-08-04 NOTE — Telephone Encounter (Signed)
Refilled medication.  Please fax prescription.

## 2011-08-04 NOTE — Telephone Encounter (Signed)
Patient called and stated that he emailed on 12/05 for a refill on his medication and pharmacy has not received it. Will need this sent to pharmacy today. Please call and advise.    Thank you  Carrington Clamp  [facility]

## 2011-08-07 ENCOUNTER — Other Ambulatory Visit: Payer: Self-pay | Admitting: FAMILY PRACTICE

## 2011-08-07 NOTE — Telephone Encounter (Signed)
Rx faxed

## 2011-08-07 NOTE — Telephone Encounter (Signed)
Error

## 2011-11-25 ENCOUNTER — Ambulatory Visit

## 2012-01-19 ENCOUNTER — Ambulatory Visit

## 2012-03-09 ENCOUNTER — Ambulatory Visit

## 2012-03-09 ENCOUNTER — Ambulatory Visit: Admitting: Dermatology

## 2012-03-09 VITALS — BP 130/66 | HR 76 | Resp 16

## 2012-03-09 NOTE — Progress Notes (Signed)
I was present with the resident during the history and exam. I discussed the case with the resident and agree with the assessment and plan we developed as documented in the resident's note. I was physically present during the entire procedure(s) indicated herein.    Yussef Jorge Wang Tye Juarez, MD MPH  Attending Physician  Department of Dermatology  [facility]

## 2012-03-09 NOTE — Progress Notes (Signed)
Today: 03/09/2012  Last visit: 05/13/11    [name] is a delightful 37yr male seen for cutaneous malignancy screening. 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 The patient denies excision site changes or changes in moles. The patient is asymptomatic.    Past Medical History:  Past Medical History:    Pain in limb                                    10/18/2006      Insomnia, unspecified                           10/18/2006      Other diseases of nasal cavity and sinuses      10/18/2006      Varicella without mention of complication                   Family History:   family history includes Cancer in his mother; Heart in his mother; Hypertension in his father; and Stroke in his father.  Social History:  History   Substance Use Topics    Smoking status: Never Smoker     Smokeless tobacco: Never Used    Alcohol Use: No     Allergies: Pcn (penicillin)  Medications: Cholecalciferol, Vitamin D3, (VITAMIN D) 1,000 unit PO Capsule, Take 2 Caps by mouth every day.  Fluticasone (FLONASE) 50 mcg/Actuation nasal spray, Instill 1 Spray into EACH nostril every day.  Zolpidem (AMBIEN) 10 mg Tablet, Take 1 tablet by mouth every day at bedtime if needed.    Review of Systems: Pertinent positives are noted in the history of present illness (HPI) above. All other review of systems is negative.    Physical Examination/ Assessment/ Plan   On physical examination, the patient is in no apparent distress and breathing comfortably. Vital signs were noted. The patient is well nourished and pleasant. The patient is alert to time, place and person. The cervical, axillary, supraclavicular and inguinal lymph nodes are all less than 1.0cm and there were are hard or immobile structures. The cutaneous areas of the head and face, neck, chest including the breast, axilla, back, abdomen, upper extremities, buttocks, and lower  extremities were examined. The genitalia was not examined. The examination of the above areas are within normal limits, except for any abnormalities are noted below.    Actinic keratosis: one pink gritty papule on left temple  ---After obtaining verbal consent one lesion was treated with liquid nitrogen x 1. Patient was informed of the potential for erythema, blister formation, scar, and post inflammatory pigment changes at the site of treatment.    Skin exam in the setting of a prior melanoma-in situ:  --Well-healed scar of the left posterior shoulder.  --No evidence of recurrence  -- He was asked to continue UVA and UVB sunblock during all hours of actinic exposure.  If there are any new or changing lesions, I have asked the patient to call the office.   -- Continue routine skin check    Benign-appearing nevi: Multiple tan to brown macules and papules with even border and color and benign pigment network under epiluminescence microscopy on the trunk, upper extremities, and lower extremities.   --Pt. was encouraged to perform self-skin examination approximately once a month to  observe any changes in these currently benign-appearing nevi.     Seborrheic keratoses, irritated:Total of two brown, verrucous, stuck on papules are present. One is located immediately superior to the left eyebrow and one is located on the anterior aspect of the right chest.   --After obtaining verbal consent, two lesions were treated with liquid nitrogen x 1. The risks, including pain, blister formation, infection, hypo/hyperpigmentation, scar, recurrence, and the need for further treatment were discussed with the patient and informed consent was obtained verbally.    Cherry angiomas: Scattered brightly erythematous, vascular papules on the trunk and extremities.   --Benign nature of these skin lesions was explained to the patient. No treatment is indicated at this time. The patient has been educated about self examination, and if changes  occur including change in size,color or if lesions become symptomatic, the patient was instructed to return for follow up.     Solar lentigines: Multiple tan to brown, reticulate macules with moth-eaten border on epiluminescence microscopy consistent with lentigines on the face, upper back, and arms.   --Wynelle Link protection education was reviewed with the patient, including using a sunscreen with broadspectrum protection with SPF of at least 30 and above, with frequent re-application.     Medication Review:   --We discussed risks, benefits, and proper use of dermatological medications with the patient. Patient verbalizes understanding of the proper use of dermatological medications.   Education needs were identified. There were no barriers to understanding, and the patient's questions and concerns were addressed.   Thank you for allowing me to participate in the care of this patient.   We have asked [name] to return to clinic in 12 months or sooner as needed.     Patient was seen with Dr. Michail Sermon, MD, PGY2  Department of Dermatology  Pager# [phone]

## 2012-03-09 NOTE — Nursing Note (Signed)
>>   JENNIFER ALDRIDGE-GRANT     Wed Mar 09, 2012  1:32 PM  Vitals taken, allergies review, and  Pharmacy checked   [email or web address]LVN

## 2012-08-12 ENCOUNTER — Encounter: Payer: Self-pay | Admitting: FAMILY PRACTICE

## 2012-08-12 ENCOUNTER — Ambulatory Visit

## 2012-08-12 ENCOUNTER — Ambulatory Visit: Admitting: FAMILY PRACTICE

## 2012-08-12 ENCOUNTER — Telehealth: Payer: Self-pay | Admitting: FAMILY PRACTICE

## 2012-08-12 VITALS — BP 108/78 | HR 76 | Temp 98.8°F | Resp 16 | Wt 177.3 lb

## 2012-08-12 MED ORDER — LORAZEPAM 0.5 MG TABLET
0.5000 mg | ORAL_TABLET | Freq: Three times a day (TID) | ORAL | Status: DC | PRN
Start: 2012-08-12 — End: 2012-12-21

## 2012-08-12 NOTE — Progress Notes (Signed)
Chief Complaint   Patient presents with    Anxiety     Subjective: Dustin Ibarra is a 51yr old male  who presents for treatment of anxiety.  He describes recent relationship problems.  This has resulted in increased anxiety.  He would like to see a psychotherapist but none are available.  He's used Ativan in the past with some success.  He is currently not suicidal.  He's not been using alcohol.         History: I did review patient's past medical and family/social history.    Medications reviewed and updated (see medication list)    ROS: No fever chills cough congestion change in bowel or bladder pattern.    OBJECTIVE: Blood pressure 108/78, pulse 76, temperature 37.1 C (98.8 F), temperature source Oral, resp. rate 16, weight 80.423 kg (177 lb 4.8 oz).  In general he looks comfortable.  Lungs are clear.  Heart regular rate and rhythm.  Abdomen is soft nontender.  Extremities are warm without cyanosis or edema.  Mood anxious.  Affect anxious.  Judgment and insight fair.  No suicidal ideations.      ASSESSMENT & PLAN:     1. Stress    2. Situational anxiety   PLAN: Counseling.  Ativan 0.5 mg 3 times a day as needed.  Avoid alcohol.  Proceed with evaluation by psychotherapist.  Review crisis resources.  He'll check in the a MyChart in 2-3 days.              Barriers to Learning assessed: none. Patient verbalizes understanding of teaching and instructions.    Sofie Rower MD

## 2012-08-12 NOTE — Telephone Encounter (Signed)
Please add to schedule  

## 2012-08-12 NOTE — Telephone Encounter (Signed)
Patient in lobby of [address] c/o panic/anxiety, wants to speak to PCP.

## 2012-08-12 NOTE — Nursing Note (Signed)
>>   Dustin Ibarra     Fri Aug 12, 2012  5:46 PM  Vital signs taken, allergies verified, screened for pain.  Littie Deeds, MA I

## 2012-08-12 NOTE — Telephone Encounter (Signed)
Per PCP, add to schedule.

## 2012-09-28 ENCOUNTER — Telehealth: Payer: Self-pay | Admitting: FAMILY PRACTICE

## 2012-09-28 ENCOUNTER — Ambulatory Visit

## 2012-09-28 NOTE — Telephone Encounter (Signed)
Patient called requesting advise. Patient stated he has severe wax build up in his left ear for about 3 weeks now. Patient stated he has a ringing sound in ear at all times. Patient is wondering if this is dangerous and what is suggested. Patient was requesting to be seen Monday as this is his only day off, but since we are closed he is not able to come in due to work schedule. Please call to advise.  Patient is aware PCP is out for rest of day, is requesting a call back tomorrow and if he does not answer okay to leave a message. Please call to advise.    Thank you,  Maree Krabbe

## 2012-09-29 NOTE — Telephone Encounter (Signed)
Spoke to pt.  Offered appt today. He cannot make it. He has work Conservator, [employer].  He has PPO insurance.  Informed pt of all urgent care options over weekend or on Monday 2.17.14.  He stated he will most likely go to [facility] urgent care.

## 2012-09-29 NOTE — Telephone Encounter (Signed)
Please call patient and tell him he should be evaluated.  He can go to [facility], [address] [place], [place], [place] [address] [phone] on Monday.

## 2012-10-17 ENCOUNTER — Ambulatory Visit: Admitting: FAMILY PRACTICE

## 2012-10-17 ENCOUNTER — Ambulatory Visit

## 2012-10-17 ENCOUNTER — Encounter: Payer: Self-pay | Admitting: FAMILY PRACTICE

## 2012-10-17 VITALS — BP 134/78 | HR 92 | Temp 98.2°F | Wt 182.3 lb

## 2012-10-17 NOTE — Nursing Note (Signed)
>>   Dustin Ibarra     Mon Oct 17, 2012  1:54 PM  Vital signs taken, allergies verified, screened for pain, pharmacy verified.  Vernell Barrier, [place]

## 2012-10-17 NOTE — Progress Notes (Signed)
S; Dustin Ibarra is a 52yr old male is here for b/l cerumen impaction. The patient states that he feels like his ears have been impacted for the last six weeks. The patient states that he went to urgent care last week. The patient states that they tried to irrigate his ears twice, but were unsuccessful, so he was told to go see his PCP for an ENT referral to get the wax removed.     O: BP 134/78  Pulse 92  Temp(Src) 36.8 C (98.2 F)  Wt 82.691 kg (182 lb 4.8 oz)  BMI 27.31 kg/m2  GEN: healthy appearing, NAD  Ears: b/l cerumen impaction    A/P:  (380.4) Cerumen impaction  (primary encounter diagnosis)  Comment: cleared after lavage   Plan: advised patient to prophylactically clean ears once/week    (311) Depression  Comment: well controlled      Cheryll Dessert, MD  Family Medicine Physician  [phone]  [facility]

## 2012-12-21 ENCOUNTER — Other Ambulatory Visit: Payer: Self-pay | Admitting: FAMILY PRACTICE

## 2012-12-22 NOTE — Telephone Encounter (Signed)
Refilled medication.  Please fax prescription.

## 2012-12-23 NOTE — Telephone Encounter (Signed)
Prescription faxed. Minela Bridge[place], MA1

## 2013-04-12 ENCOUNTER — Telehealth: Payer: Self-pay | Admitting: FAMILY PRACTICE

## 2013-04-12 NOTE — Telephone Encounter (Signed)
Patient called in and stated he needs a note for work stating he missed 8/26 and 8/27 due to a 24 flu and patient was excused from work. Patient stated he will need by end of day today and would like a call when letter is ready.    Please assist    Thanks,  [name]  [facility]

## 2013-04-12 NOTE — Telephone Encounter (Signed)
Letter completed.  Notify patient.

## 2013-04-12 NOTE — Telephone Encounter (Signed)
Called pt. Mail box is full and cannot leave a message.  Operators: if pt calls back, please relay message as per Dr Renette Butters.

## 2013-04-24 ENCOUNTER — Other Ambulatory Visit: Payer: Self-pay

## 2013-04-30 ENCOUNTER — Other Ambulatory Visit: Payer: Self-pay

## 2013-05-06 ENCOUNTER — Other Ambulatory Visit: Payer: Self-pay

## 2013-05-20 ENCOUNTER — Other Ambulatory Visit: Payer: Self-pay

## 2013-06-13 ENCOUNTER — Other Ambulatory Visit: Payer: Self-pay | Admitting: FAMILY PRACTICE

## 2013-06-13 ENCOUNTER — Telehealth: Payer: Self-pay | Admitting: FAMILY PRACTICE

## 2013-06-13 MED ORDER — LORAZEPAM 0.5 MG TABLET
1.0000 | ORAL_TABLET | Freq: Three times a day (TID) | ORAL | Status: DC | PRN
Start: 2013-06-13 — End: 2013-09-18

## 2013-06-13 NOTE — Telephone Encounter (Signed)
Please see previous telephone encounter from 10/28.    Patient called back stating that pharmacy has not received Ativan prescription.  Please re-fax.

## 2013-06-13 NOTE — Telephone Encounter (Signed)
Refilled medication.  Please notify the patient.  Fax prescription.

## 2013-06-13 NOTE — Telephone Encounter (Signed)
Called in prescription for ATIVAN to [facility] [place] on [address], Spoke to patient, aware pharmacy is filling now. Melonie Florida LVN

## 2013-06-13 NOTE — Telephone Encounter (Signed)
Prescription faxed.   Notified patient via voicemail. ( voicemail verified). Leonie Man, [place]

## 2013-06-13 NOTE — Telephone Encounter (Signed)
Requested Prescriptions     Pending Prescriptions Disp Refills    Lorazepam (ATIVAN) 0.5 mg Tablet 12 tablet 0     Sig: Take 1 tablet by mouth three times daily if needed. for anxiety         Patient called in for refill and needs filled as soon as possible.  He is at the pharmacy waiting an needs in the next 10 minutes before his flight.      Please call in the prescription at [phone].    Thank you,     Severiano Gilbert

## 2013-06-15 ENCOUNTER — Telehealth: Payer: Self-pay | Admitting: FAMILY PRACTICE

## 2013-06-15 NOTE — Telephone Encounter (Signed)
Dustin Ibarra is a 52yr old male    3 patient identifiers used.    Per:   patient    Reason for Call:  Fever, Dysuria, Congestion    Symptoms:    Fever and chills x 2 days. Painful urination. Urine is darker than usual and has a foul-smell. Malaise. Denies flank pain. Head congestion. Denies headache. Patient was flying all day yesterday - home from [place]. Patient is now in [place].     Denies travel to [place] in the past 21 days or exposure to anyone with EBV.    Homecare and/or Medications given:  Tylenol, Hydration    Advice:  Advised patient he should be seen today in nearest Urgent Care.  Continue hydration and (Tylenol) acetaminophen for pain. Follow directions on the label and NTE 4000mg  per day.    Pain: yes    Pain location and 1-10:  Dysuria           Disposition: Urgent Care    Per:   patient verbalizes agreement to plan. Agrees to callback with any increase in symptoms/concerns or questions.     Christiane Ha, RN  PCN Triage  [phone]

## 2013-06-19 ENCOUNTER — Telehealth: Payer: Self-pay | Admitting: FAMILY PRACTICE

## 2013-06-19 NOTE — Telephone Encounter (Signed)
Please arrange follow up appointment to see me.

## 2013-06-19 NOTE — Telephone Encounter (Signed)
Does pt need appt prior to referral?

## 2013-06-19 NOTE — Telephone Encounter (Signed)
Patient calling in for a referral.  He went to urgent care in the [place] He saw Dr. Valentino Nose at [facility].  They recommended he see a urologist in the next few days for prostatitis.    He will be returning to [facility] in a couple days.    Please assist. Please call.    Thank you,     Dustin Ibarra

## 2013-06-20 NOTE — Telephone Encounter (Signed)
Patient called in gave him message that Dr. Renette Butters would like him to come in for an appt.    Patient stated that he would like to speak to Dr. Renette Butters because he feels like he will need to see a urologist for this issue.  He said if Dr. Renette Butters can give him a call that would be preferred or if he could be fit in to see Dr. Renette Butters tomorrow he can do that as well.    Please assist.    Thank you,     Dustin Ibarra

## 2013-06-20 NOTE — Telephone Encounter (Signed)
Please call patient and tell him I referred him to Urologist.  He should  call [phone] or [phone] to schedule his appointment.

## 2013-06-21 NOTE — Telephone Encounter (Signed)
Mailbox is full and cannot accept messages at this time. Littie Deeds, MA1

## 2013-06-28 NOTE — Telephone Encounter (Signed)
Mail box is full and I cannot leave a message. Will try again.

## 2013-06-29 NOTE — Telephone Encounter (Signed)
Called pt again.  Mail box is full and cannot leave a message.  Message closed at this time.

## 2013-07-01 ENCOUNTER — Other Ambulatory Visit: Payer: Self-pay

## 2013-09-18 ENCOUNTER — Encounter: Payer: Self-pay | Admitting: FAMILY PRACTICE

## 2013-09-18 ENCOUNTER — Ambulatory Visit: Payer: Commercial Managed Care - PPO | Admitting: FAMILY PRACTICE

## 2013-09-18 VITALS — BP 120/80 | HR 84 | Resp 16 | Ht 68.5 in | Wt 185.0 lb

## 2013-09-18 DIAGNOSIS — C439 Malignant melanoma of skin, unspecified: Secondary | ICD-10-CM

## 2013-09-18 DIAGNOSIS — E559 Vitamin D deficiency, unspecified: Secondary | ICD-10-CM

## 2013-09-18 DIAGNOSIS — G4700 Insomnia, unspecified: Secondary | ICD-10-CM

## 2013-09-18 DIAGNOSIS — F419 Anxiety disorder, unspecified: Secondary | ICD-10-CM

## 2013-09-18 DIAGNOSIS — Z0000 Encounter for general adult medical examination without abnormal findings: Principal | ICD-10-CM

## 2013-09-18 DIAGNOSIS — D039 Melanoma in situ, unspecified: Secondary | ICD-10-CM

## 2013-09-18 DIAGNOSIS — J3489 Other specified disorders of nose and nasal sinuses: Secondary | ICD-10-CM

## 2013-09-18 MED ORDER — ZOLPIDEM 10 MG TABLET
1.0000 | ORAL_TABLET | Freq: Every day | ORAL | Status: DC | PRN
Start: 2013-09-18 — End: 2016-11-09

## 2013-09-18 MED ORDER — LORAZEPAM 0.5 MG TABLET
1.0000 | ORAL_TABLET | Freq: Three times a day (TID) | ORAL | Status: AC | PRN
Start: 2013-09-18 — End: 2013-10-18

## 2013-09-18 MED ORDER — FLUTICASONE PROPIONATE 50 MCG/ACTUATION NASAL SPRAY,SUSPENSION
1.0000 | Freq: Every day | NASAL | Status: AC
Start: 2013-09-18 — End: 2019-04-12

## 2013-09-18 NOTE — Progress Notes (Signed)
Dustin Ibarra  presents for preventive care.  His last physical was 2 year(s) ago. In general he has been feeling well and functioning well at home, work, and personal relationships.  He recent episode of prostatitis which resolved with Cipro.  He request refill of nasal steroid.  He request refill of Ambien.  He request a refill of Ativan which he uses rarely.    Prostate exams have been normal.     Immunization History   Administered Date(s) Administered    Influenza Vaccine (Fluarix) 06/13/2008    Influenza Vaccine (Fluzone/fluarix Prefill Syringe) 05/30/2010, 05/21/2011    Tdap (Adacel ) 10/18/2006       Family history is significant for unchanged    Sexual History: is heterosexual    Social history: I  did review and update substance use history form.  His routine exercise includes 30 minutes of aerobic activity 4 days per week, and his diet is low fat/low cholesterol.   CAGE questions positive for no to all    Safety issues:  The patient does routinely use seatbelts.  He does  have smoke detectors, with biannual power checks, in his house.      ROS:   Constitutional: negative.  Eyes: negative.  Ears, Nose, Mouth, Throat: negative.  CV: negative.  Resp: negative.  GI: negative.  GU: negative.  Musculoskeletal: negative.  Integumentary: negative.  Neuro: negative.  Psych: negative.  Endo: negative.  Heme/Lymphatic: negative.  Allergy/Immun: negative.    OBJECTIVE:   General Appearance: healthy, alert, no distress, pleasant affect, cooperative.  Eyes:  conjunctivae and corneas clear. PERRL, EOM's intact. Fundi benign.  Ears:  normal TMs and canal.  Nose:  normal.  Mouth: normal.  Neck:  Neck supple. No adenopathy, thyroid symmetric, normal size.  Heart:  normal rate and regular rhythm, no murmurs, clicks, or gallops.  Lungs: clear to auscultation and percussion, no chest deformities noted.  Abdomen: BS normal.  Abdomen soft, non-tender.  No masses or organomegaly.  Rectal: Normal prostate without masses.   Extremities:  no cyanosis, clubbing, or edema.  Skin:  negative.  Neuro: Gait normal. Reflexes normal and symmetric. Sensation and strength grossly normal.  Mental Status: Appearance/Cooperation: in no apparent distress, well developed and well nourished and non-toxic       ASSESSMENT & PLAN:   (V70.0) Routine general medical examination at health care facility  (primary encounter diagnosis)  Plan: CBC AUTO + REFLEX MANUAL DIFF, LIPID PANEL WITH        DLDL REFLEX, TSH WITH FREE T4 REFLEX,         COMPREHENSIVE METABOLIC PANEL, VITAMIN D, 25         HYDROXY        Nutritional counseling.  Exercise counseling.  Education regarding safety.  Recommend colonoscopy he declines.  Provided information on prostate cancer.    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 Patient verbalizes understanding  of teaching and instructions.    Cinda Quest MD    Note: this chart was created in part with Par[employer]. Every effort has been made to correct any errors in the voice-recognition / dictation, but some may have been missed. If there are any questions, please contact the author for clarification and revision if needed.

## 2013-09-18 NOTE — Patient Instructions (Signed)
New lab tests have been ordered. Please do these tests after a 12 hour fast (nothing to eat or drink except water) in 12 weeks. The lab tests have been ordered through the computer an no paper lab slip is needed and no appointment is needed. The lab is located on the first floor of the clinic building in the waiting area for [address].  Hours: 7:30 - 12:00 AM/12:30 - 4:30 PM Walk in.  Closed 12:00 - 12:30 PM        - - - - - - - - - - - - - - - Information on Prostate Cancer - - - - - - - - - - - - - - -    The prostate is a gland, about the size of a walnut, located under the bladder and surrounding the upper part of the urethra. The urethra is a tube that carries urine and semen through the penis to the outside of the body. The prostate gland is found only in men. It produces semen, the liquid that carries the sperm when a man ejaculates.    Prostate cancer starts in the cells of the prostate gland. When a man has prostate cancer, the prostate cancer cells grow into a tumor. These cancer cells can also spread into other parts of the body.    The most common symptom of prostate cancer is no symptom at all.     Risk Factors and Prevention:    While 1 man out of 6 will be diagnosed with prostate cancer during his lifetime, only 1 man in 34 will die of this disease. The death rate for prostate cancer is going down. And the disease is being found earlier as well.     Prostate cancer is a disease that is often associated with aging. About 75 percent of men are aged 65 years and older when diagnosed with prostate cancer.     African American men are at especially high risk for prostate cancer: They are over 50 percent more likely to develop this disease than non-Hispanic white men. African American men are twice as likely to die of this disease. Prostate cancer occurs less often in Asian men than in whites.    Men with close family members (father or brother) who have had prostate cancer are more likely to get it  themselves, especially if their relatives were young when they got the disease.     In [place], approximately 18,865 men will be diagnosed with prostate cancer in 2007, 3,010 men will die from it and 114,600 men will continue living with it.     Because we don?t know the exact cause of prostate cancer, it is not possible to give good advice about prevention. Some experts think that diet can reduce the risk of developing prostate cancer by eating less red meat and fat, and eating more fruits, vegetables and grains.    Prostate Cancer Screening Options:    Prostate cancer can often be found early by testing the amount of Prostate-Specific Antigen (PSA) in a man?s blood. PSA is a substance made by the normal prostate gland. Although PSA is mostly found in semen, a small amount is also found in the blood. There is no question that the PSA test can help spot prostate cancer. But it can?t tell how dangerous the cancer is.    Another way to find prostate cancer is the Digital Rectal Exam (DRE). To do the DRE, the doctor inserts a gloved, lubricated   finger into the rectum to feel for any irregular or firm areas that might be cancer. The DRE is less effective than the PSA blood test in finding prostate cancer, but it can sometimes find cancers in men with normal PSA levels. For this reason, the American Cancer Society (ACS) recommends that when prostate cancer screening is done, both the DRE and the PSA should be used.     These tests are not always accurate, however. Wrong test results could lead to excess worry, or even an unneeded biopsy or other tests. Until more is known, one should talk to his doctor about whether or not he needs to be tested. Things to take into account are age and health. If one is young and gets prostate cancer, it will probably shorten his life if it is not caught early. But if one is older or in poor health, then prostate cancer may never become a major problem because it often grows so slowly.      ACS believes that doctors should offer the PSA blood test and DRE yearly, beginning at age 50 to men who do not have any major medical problems and can be expected to live at least 10 more years. Men at high risk should begin testing at age 45. Men at high risk include African Americans and men who have a close relative who had prostate cancer before age 65. Men at even higher risk (have several close relatives with prostate cancer at an early age) could begin testing at age 40.     If certain symptoms or the results of early tests suggest one might have prostate cancer, the doctor will use further tests to find out whether the disease is present.    The prostate biopsy is the only way to know for sure if a man has prostate cancer. During a biopsy, tissue from a man?s prostate is removed so it can be sent to the lab to see if there are cancer cells. A core needle biopsy is the main method used.    Cancer Treatment:    Most of the time, prostate cancer grows slowly. Autopsy studies show that many older men who died of other diseases also had prostate cancer that neither they nor their doctor were aware of. But sometimes prostate cancer can grow and spread quickly. Even with the latest methods, it is hard to tell which prostate cancers will grow slowly and which will grow quickly.     If cancer is present, the biopsy sample will be graded. Grading the cancer helps to predict how fast the cancer is likely to grow and spread. A staging system is a way to describe the extent to which the cancer has spread.    Surgery, radiation, and hormone therapy are the most common treatments for prostate cancer. Chemotherapy may be used in some cases, and watchful waiting, though not an active form of treatment, may also be used.    There are risks and significant side effects with all forms of treatment for prostate cancer. They include impotence, sterility, and incontinence.    Several factors should be taken into account  before one chooses a course of treatment. These factors include age, overall health, goals for treatment, and one?s feelings about side effects, risks and complications of treatment.. Some men, for example, can?t imagine living with side effects such as incontinence or impotence. Others are less concerned about these and more concerned about getting rid of the cancer.  It is often helpful   to discuss treatment options with more than one doctor. Many men find that talking to others who have faced the same issues is helpful.    Prostate Cancer Survival Rates:    Overall, 99 percent of men diagnosed with prostate cancer survive at least 5 years. Ninety one percent of all prostate cancers are found while they are still within the prostate or only in nearby areas. Nearly 100 percent of these men survive at least 5 years. For the men whose cancer has already spread to distant parts of the body when it is found, 34 percent will survive at least 5 years.    Sources:  1. American Cancer Society, Overview: Prostate Cancer. Available @ www.cancer.org. Accessed:  January 2007  2. IMPACT, What Every Man Should Know About Prostate Cancer? Brochure.  Available @ http://www.[place]-impact.org/public/genlbro.pdf . Accessed: January 2007.  3. American Cancer Society, [place] Division, and Public health Institute, [place] Cancer  Registry, [place] Cancer Facts & Figures 2007, [place]. September 2006

## 2013-09-18 NOTE — Nursing Note (Signed)
>>   [facility] MAYES     Mon Sep 18, 2013 10:23 AM  Vitals Taken. Loney Hering, [facility]

## 2013-10-09 ENCOUNTER — Ambulatory Visit: Payer: Commercial Managed Care - PPO | Attending: FAMILY PRACTICE

## 2013-10-09 DIAGNOSIS — E559 Vitamin D deficiency, unspecified: Secondary | ICD-10-CM | POA: Insufficient documentation

## 2013-10-09 DIAGNOSIS — Z0000 Encounter for general adult medical examination without abnormal findings: Principal | ICD-10-CM | POA: Insufficient documentation

## 2013-10-09 LAB — CBC WITH DIFFERENTIAL
BASOPHILS % AUTO: 0.6 %
BASOPHILS ABS AUTO: 0 10*3/uL (ref 0–0.2)
EOSINOPHIL % AUTO: 5.1 %
EOSINOPHIL ABS AUTO: 0.2 10*3/uL (ref 0–0.5)
HEMATOCRIT: 44.4 % (ref 41–53)
HEMOGLOBIN: 14.9 g/dL (ref 13.5–17.5)
LYMPHOCYTE ABS AUTO: 1.2 10*3/uL (ref 1.0–4.8)
LYMPHOCYTES % AUTO: 26.8 %
MCH: 30.9 pg (ref 27–33)
MCHC: 33.5 % (ref 32–36)
MCV: 92.3 UM3 (ref 80–100)
MONOCYTES % AUTO: 9.6 %
MONOCYTES ABS AUTO: 0.4 10*3/uL (ref 0.1–0.8)
MPV: 8.2 UM3 (ref 6.8–10.0)
NEUTROPHIL ABS AUTO: 2.6 10*3/uL (ref 1.80–7.70)
NEUTROPHILS % AUTO: 57.9 %
PLATELET COUNT: 224 10*3/uL (ref 130–400)
RDW: 13.1 U (ref 0–14.7)
RED CELL COUNT: 4.81 10*6/uL (ref 4.5–5.9)
WHITE BLOOD CELL COUNT: 4.6 10*3/uL (ref 4.5–11.0)

## 2013-10-09 LAB — COMPREHENSIVE METABOLIC PANEL
ALANINE TRANSFERASE (ALT): 21 U/L (ref 6–63)
ALBUMIN: 4.2 g/dL (ref 3.4–4.8)
ALKALINE PHOSPHATASE (ALP): 64 U/L (ref 35–115)
ASPARTATE TRANSAMINASE (AST): 23 U/L (ref 15–43)
BILIRUBIN TOTAL: 0.8 mg/dL (ref 0.3–1.3)
CALCIUM: 9.2 mg/dL (ref 8.6–10.5)
CARBON DIOXIDE TOTAL: 25 meq/L (ref 24–32)
CHLORIDE: 104 meq/L (ref 95–110)
CREATININE BLOOD: 0.82 mg/dL (ref 0.44–1.27)
GLUCOSE: 85 mg/dL (ref 70–99)
POTASSIUM: 3.9 meq/L (ref 3.3–5.0)
PROTEIN: 7 g/dL (ref 6.3–8.3)
SODIUM: 137 meq/L (ref 135–145)
UREA NITROGEN, BLOOD (BUN): 11 mg/dL (ref 8–22)

## 2013-10-09 LAB — LIPID PANEL WITH DLDL REFLEX
CHOLESTEROL: 224 mg/dL — AB (ref 0–200)
HDL CHOLESTEROL: 61 mg/dL (ref >=35)
LDL CHOLESTEROL CALCULATION: 145 mg/dL — AB (ref <130)
NON-HDL CHOLESTEROL: 163 mg/dL — AB (ref 0–160)
TOTAL CHOLESTEROL:HDL RATIO: 3.7 (ref <4.0)
TRIGLYCERIDE: 90 mg/dL (ref 35–160)

## 2013-10-09 LAB — VITAMIN D, 25 HYDROXY: VITAMIN D, 25 HYDROXY: 15.3 ng/mL — AB (ref 30.0–100.0)

## 2013-10-09 LAB — TSH WITH FREE T4 REFLEX: THYROID STIMULATING HORMONE: 1.19 u[IU]/mL (ref 0.35–3.30)

## 2013-10-10 ENCOUNTER — Encounter: Payer: Self-pay | Admitting: FAMILY PRACTICE

## 2013-10-10 DIAGNOSIS — E7800 Pure hypercholesterolemia, unspecified: Secondary | ICD-10-CM | POA: Insufficient documentation

## 2013-10-10 NOTE — Telephone Encounter (Signed)
From: [name]  To: [name], MD  Sent: 10/09/2013 8:46 PM PST  Subject:  Non-urgent Medical Advice Question    Hello  Dr Phillips Climes, I could do better taking my Vitamin D so will start paying more attention to that . Also, is above 200 really that big of a deal w/ cholesterol? Years ago, there wasn't that much made of it as long as it wasn't over 300 or so. I've heard that risk for heart disease is only above 300, is that true? Also, my dad had heart disease, but went on a reversal diet, the Dr Francis Gaines one. He had his heart attack in 1994/11/30, then passed away in 30-Nov-2002 at age 69 from a hemorrhagic stroke. He did the blood thinners, protime which ultimately thins the blood too much and then you succumb to a stroke. Are there really any ways to really defeat the clogging of arteries and the damage caused by just living your life? Not in the long run, [facility], You just have to to the best you can. My dad, the original Dr. Ronnald Ramp, would say, "it's not what you're eating, but what's eating you," Thanks for the quick response and analysis of my test results. Best regards, Elta Guadeloupe

## 2013-10-30 ENCOUNTER — Telehealth: Payer: Self-pay | Admitting: FAMILY PRACTICE

## 2013-10-30 NOTE — Telephone Encounter (Signed)
I suggest evaluation now at the closest urgent care.

## 2013-10-30 NOTE — Telephone Encounter (Signed)
Your patient has called the office with questions about possible Flu symptoms he has provided the following answers to these questions:    1- How long have you been ill?Since last night  2- Were you well before you came down with these symptoms?  Yes  3- Do you have a cough? No  4- Do you have a sore throat? No  5- Have you had a fever ?  Yes, unsure of how high  6- Do you have muscle aches?Yes   7- Are you having any nausea, vomiting and/or diarrhea? Yes   8- Are you having trouble thinking clearly? No   9- Are you having trouble breathing ? no  10- Are you having trouble drinking liquids? None    Patient asking to be worked in today if at all possible, I did advise there was nothing open and asked if patient wanted medical advice and patient dec,ined.    Please assist    Thanks,  [facility]

## 2013-10-30 NOTE — Telephone Encounter (Signed)
Urgent care

## 2013-10-30 NOTE — Telephone Encounter (Signed)
Patient advised below message from MD.   Reino Kent, MA

## 2014-05-13 ENCOUNTER — Other Ambulatory Visit: Payer: Self-pay

## 2014-10-29 ENCOUNTER — Other Ambulatory Visit: Payer: Self-pay | Admitting: FAMILY PRACTICE

## 2014-10-29 DIAGNOSIS — F419 Anxiety disorder, unspecified: Principal | ICD-10-CM

## 2016-07-13 ENCOUNTER — Encounter: Payer: Self-pay | Admitting: FAMILY PRACTICE

## 2016-07-13 ENCOUNTER — Ambulatory Visit (INDEPENDENT_AMBULATORY_CARE_PROVIDER_SITE_OTHER): Payer: Commercial Managed Care - PPO | Admitting: FAMILY PRACTICE

## 2016-07-13 DIAGNOSIS — R05 Cough: Secondary | ICD-10-CM

## 2016-10-07 ENCOUNTER — Ambulatory Visit: Payer: Commercial Managed Care - PPO | Admitting: Family Medicine

## 2016-10-07 ENCOUNTER — Encounter: Payer: Self-pay | Admitting: Family Medicine

## 2016-10-07 VITALS — BP 151/84 | HR 94 | Temp 98.9°F | Resp 16 | Wt 188.9 lb

## 2016-10-07 DIAGNOSIS — R05 Cough: Secondary | ICD-10-CM

## 2016-10-07 DIAGNOSIS — J111 Influenza due to unidentified influenza virus with other respiratory manifestations: Principal | ICD-10-CM

## 2016-10-07 DIAGNOSIS — R509 Fever, unspecified: Secondary | ICD-10-CM

## 2016-10-07 DIAGNOSIS — J3489 Other specified disorders of nose and nasal sinuses: Secondary | ICD-10-CM

## 2016-10-07 LAB — POC FLU TEST: POC FLU TEST: NEGATIVE

## 2016-10-07 NOTE — Nursing Note (Signed)
Vital signs taken,tobacco,allergies,pharmacy verified, screened for pain.  Chong Wojdyla, MA

## 2016-10-07 NOTE — Patient Instructions (Signed)
For immune support:    vitamin D 4,000IU/ day for 2 weeks  C 500 - 1000 mg  Zinc 25mg    start in 1stSambucol  48hrs of illness    Garlic, raw minced in honey - 1 tsp three times per  day (or 6-7 cloves/ day)  Push Fluids   Rest.     Guaifenesin to drain and bring up phlegm     Aleve as needed pain and fever  No decogestants

## 2016-10-07 NOTE — Nursing Note (Signed)
Flu test performed. Thank you. Jazz MA I

## 2016-10-08 NOTE — Progress Notes (Signed)
Chief Complaint   Patient presents with    Fever    Fatigue        SUBJECTIVE: Dustin Ibarra is a 56yr old male who presents with sore throat, rhinorrhea, cold, cough and fever 1 day.  Would like screening for flu.  No wheezing or shortness of breath.    family history includes Cancer in his mother; Heart in his mother; Hypertension in his father; Stroke in his father.  Social History   Substance Use Topics    Smoking status: Never Smoker    Smokeless tobacco: Never Used    Alcohol use No     Past Surgical History:   Procedure Laterality Date    PR APPENDECTOMY  1968     Family History   Problem Relation Age of Onset    Heart Mother      CHF    Hypertension Father     Stroke Father      hemorrhagic    Cancer Mother      breast     Patient Active Problem List   Diagnosis    Pain in limb    Insomnia    Other diseases of nasal cavity and sinuses(478.19)    Other and unspecified nonspecific immunological findings    Melanoma in situ    Sleep apnea    Vitamin D deficiency    High cholesterol           Current Outpatient Prescriptions:     Cholecalciferol, Vitamin D3, (VITAMIN D) 1,000 unit PO Capsule, Take 2 Caps by mouth every day., Disp: 60 Cap, Rfl: 12    Fluticasone (FLONASE) 50 mcg/actuation nasal spray, Instill 1 spray into EACH nostril every day., Disp: 48 g, Rfl: 3    Zolpidem (AMBIEN) 10 mg Tablet, Take 1 tablet by mouth every day at bedtime if needed., Disp: 15 tablet, Rfl: 0     R.O.S. See above   BP 151/84  Pulse 94  Temp 37.2 C (98.9 F) (Temporal)  Resp 16  Wt 85.7 kg (188 lb 15 oz)  SpO2 96%  BMI 28.31 kg/m2     OBJECTIVE: General Appearance: Alert, oriented, cooperative, no significant distress.  Ear exam - both sides normal, TM intact without perforation or effusion, external canal normal. No significant cerumenosis noted.  Eyes: normal conjunctiva, no periorbital redness or edema.  Mouth: Pharynx with erythema, no exudate or tonsillar abnormalities.  Heart: Regular rate and  rhythm. No murmurs, extra heart sounds.  Lungs: Good air entry, no rales or wheezing.    Rapid flu negative  ASSESSMENT:    (J11.1) Flu syndrome  (primary encounter diagnosis)  Comment: see patient education   Plan: POC FLU TEST        Call if symptoms worsen, or fail to resolve as anticipated.       I reviewed patient's past medical and social history.  Barriers to Learning assessed: none. Patient verbalizes understanding of teaching and instructions.  Necia Kamm Eliezer Bottom, MD

## 2016-11-09 ENCOUNTER — Ambulatory Visit: Payer: Commercial Managed Care - PPO | Admitting: FAMILY PRACTICE

## 2016-11-09 ENCOUNTER — Encounter: Payer: Self-pay | Admitting: FAMILY PRACTICE

## 2016-11-09 VITALS — BP 134/80 | HR 85 | Resp 12 | Ht 68.5 in | Wt 189.8 lb

## 2016-11-09 DIAGNOSIS — Z3009 Encounter for other general counseling and advice on contraception: Secondary | ICD-10-CM

## 2016-11-09 DIAGNOSIS — E7800 Pure hypercholesterolemia, unspecified: Secondary | ICD-10-CM

## 2016-11-09 DIAGNOSIS — R399 Unspecified symptoms and signs involving the genitourinary system: Secondary | ICD-10-CM

## 2016-11-09 DIAGNOSIS — Z0000 Encounter for general adult medical examination without abnormal findings: Principal | ICD-10-CM

## 2016-11-09 DIAGNOSIS — E559 Vitamin D deficiency, unspecified: Secondary | ICD-10-CM

## 2016-11-09 DIAGNOSIS — G4739 Other sleep apnea: Secondary | ICD-10-CM

## 2016-11-09 DIAGNOSIS — Z23 Encounter for immunization: Secondary | ICD-10-CM

## 2016-11-09 DIAGNOSIS — Z1211 Encounter for screening for malignant neoplasm of colon: Secondary | ICD-10-CM

## 2016-11-09 DIAGNOSIS — R1909 Other intra-abdominal and pelvic swelling, mass and lump: Secondary | ICD-10-CM

## 2016-11-09 DIAGNOSIS — D039 Melanoma in situ, unspecified: Secondary | ICD-10-CM

## 2016-11-09 NOTE — Progress Notes (Signed)
Patient presents with:  Physical      Subjective:   Dustin Ibarra is a 56yr male who presents for annual examination.  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  He is overdue for followup with dermatologist.  He has a history of vitamin D deficiency.  He has not been taking supplemental vitamin D.  Recently at work physical he was found to have right groin hernia.  He's asymptomatic.  He's never been screened for colon cancer.  He is reluctant to do colonoscopy.  He's currently in a relationship with a 55 year old woman.  He is interested in permanent sterilization.  He complains of intermittent difficulty emptying his bladder    Past Medical History:  10/18/2006: Insomnia, unspecified  10/18/2006: Other diseases of nasal cavity and sinuses(478*  10/18/2006: Pain in limb      Comment: right foot  No date: Varicella without mention of complication    Past Surgical History:  1968: PR APPENDECTOMY        Current Outpatient Prescriptions:  Fluticasone (FLONASE) 50 mcg/actuation nasal spray, Instill 1 spray into EACH nostril every day.    No current facility-administered medications for this visit.       Allergies    Pcn (Penicillin) [Penicillins]    Hives    Comment:Given shot for reaction.    Social History    Marital status: SINGLE              Girlfriends name: Carymdee              Years of education: BA              Number of children: 1             Occupational History           Pharmacist, hospital                            Social History Main Topics    Smoking status: Never Smoker                                                                Smokeless status: Never Used                        Alcohol use: Yes                Comment: 6 per week    Drug use: No              Sexual activity: Yes               Partners with: Male       Birth control/protection: Condom    Other Topics            Concern  Military Service        No  Blood Transfusions      No  Caffeine Concern        No  Occupational Exposure    No  Hobby Hazards           No  Sleep Concern           No  Stress Concern  No  Weight Concern          No  Special Diet            No  Back Care               No  Exercise                Yes  Bike Helmet             Yes  Seat Belt               Yes  Self-Exams              Yes    Social History Narrative  Insurance risk surveyor        Review of patient's family history indicates:    Heart                          Mother                      Comment: CHF    Cancer                         Mother                      Comment: breast    Hypertension                   Father                    Stroke                         Father                      Comment: hemorrhagic        Immunization History  Administered            Date(s) Administered    Influenza Vaccine (Fluarix)                          06/13/2008      Influenza Vaccine (Fluzone/fluarix Prefill Syringe)                          05/30/2010  05/21/2011      Tdap (Adacel )        10/18/2006    Pended                  Date(s) Pended    Tdap (Adacel )        11/09/2016      Zoster Vaccine (recombinant)                          11/09/2016      ROS:   Constitutional: negative.  Eyes: negative.  Ears, Nose, Mouth, Throat: negative.  CV: negative.  Resp: negative.  GI: negative.  GU: Occasional difficulty and can bladder.  Musculoskeletal: negative.  Integumentary: negative.  Neuro: negative.  Psych: negative.  Endo: negative.  Heme/Lymphatic: negative.  Allergy/Immun: negative.    OBJECTIVE:   BP 134/80  Pulse 85  Resp 12  Ht 1.74 m (5' 8.5")  Wt 86.1 kg (189 lb 13.1 oz)  BMI  28.44 kg/m2  General Appearance: healthy, alert, no distress, pleasant affect, cooperative.  Eyes:  conjunctivae and corneas clear. PERRL, EOM's intact.   Ears:  normal TMs and canal.  Nose:  normal.  Mouth: normal.  Neck:  Neck supple. No adenopathy, thyroid symmetric, normal size.  Heart:  normal rate and regular rhythm, no murmurs.  Lungs: clear to auscultation, no chest deformities  noted.  Abdomen: BS normal.  Abdomen soft, non-tender.  No masses or organomegaly.  Left groin defect  GU:  Normal male genitalia.  Testicles descended bilaterally..  Rectal: Normal tone.  Slightly enlarged prostate.  Extremities:  no cyanosis, clubbing, or edema.  Skin:  negative.  Neuro: Gait normal. Reflexes normal and symmetric. Sensation and strength grossly normal.  Mental Status: Appearance/Cooperation: in no apparent distress, well developed and well nourished and non-toxic    ASSESSMENT & PLAN:   (Z00.00) Annual physical exam  (primary encounter diagnosis)  Plan: CBC WITH DIFFERENTIAL, LIPID PANEL WITH DLDL         REFLEX, COMPREHENSIVE METABOLIC PANEL, PSA         SCREEN, HEPATITIS C AB SCREEN, VITAMIN D, 25         HYDROXY        Nutritional counseling.  Exercise counseling.  Education regarding safety.  I provided information on GYN cancers.    (Z23) Need for Tdap vaccination  Plan: TDAP VACCINE IM  (ADACEL)            (Z23) Need for shingles vaccine  Plan: ZOSTER VACCINE (RECOMBINANT)        Patient will return to clinic in one month to initiate vaccination series    (E78.00) High cholesterol  Plan: LIPID PANEL WITH DLDL REFLEX        Nutritional counseling    (E55.9) Vitamin D deficiency  Comment: Noncompliant  Plan: VITAMIN D, 25 HYDROXY            (R39.9) Lower urinary tract symptoms (LUTS)  Plan: URINALYSIS AND CULTURE IF IND            (G47.39) Other sleep apnea  Comment: Mild  Plan: Sleep on side.  Avoid alcohol.  Weight loss.    (D03.9) Melanoma in situ, unspecified site  Plan: [place]            (Z12.11) Screen for colon cancer  Plan: GASTROENTEROLOGY REFERRAL            (Z30.09) Vasectomy evaluation  Plan: [place]            (R19.09) Bulge in groin area  Comment: Probable hernia  Plan: US SOFT TISSUE ABDOMEN WALL / HERNIA              Barriers to Learning assessed: none. Patient verbalizes understanding of teaching and instructions.    Cinda Quest  MD    Note: this chart was created in part with [employer]. Every effort has been made to correct any errors in the voice-recognition / dictation, but some may have been missed. If there are any questions, please contact the author for clarification and revision if needed.

## 2016-11-09 NOTE — Nursing Note (Signed)
Patient declined to get shingrix vaccine today, states will make nurse appointment. Immunization VIS documentation(s) were given to [facility] to review. All questions were answered and the patient consented to the Immunization(s) being given. Patient allergies were reviewed and no contraindications were found. The Tdap (Adacel) immunization(s) were given as ordered. The patient was observed for any immediate reactions to the vaccine. None were observed. Jaquita Rector LVN

## 2016-11-09 NOTE — Nursing Note (Signed)
Vitals Taken. Jefry Lesinski, MAII

## 2016-11-09 NOTE — Patient Instructions (Addendum)
Please obtain TDAP vaccine prior to discharge.  Arrange an appointment with nurse for shingles vaccine.    New lab tests have been ordered. Please do these tests after a 12 hour fast (nothing to eat or drink except water) in 1-2 weeks. The lab tests have been ordered through the computer an no paper lab slip is needed and no appointment is needed. The lab is located on the first floor of the clinic building in the waiting area for [place].  Hours: 7:30 - 12:00 AM/12:30 - 4:30 PM Walk in.  Closed 12:00 - 12:30 PM    An electronic referral has been submitted to RADIOLOGY for Ultrasound. You should call radiology at [phone] in 2 days to schedule your appointment    An electronic referral has been submitted to GASTROENTEROLOGY for further evaluation and treatment of your problem. You should receive an approval notice in the mail within 2 weeks. After the approval notice has been received, or if a notice is not received within two weeks, you may call [phone] or [phone] to schedule your appointment.    An electronic referral has been submitted to UROLOGY for further evaluation and treatment of your problem. You should receive an approval notice in the mail within 2 weeks. After the approval notice has been received, or if a notice is not received within two weeks, you may call [phone] or [phone] to schedule your appointment.     An electronic referral has been submitted to DERMATOLOGY for further evaluation and treatment of your problem. You should receive an approval notice in the mail within 2 weeks. After the approval notice has been received, or if a notice is not received within two weeks, you may call [phone] or [phone] to schedule your appointment.        - - - - - - - - - - - - - - - Information on Prostate Cancer - - - - - - - - - - - - - - -    The prostate is a gland, about the size of a walnut, located under the bladder and surrounding the upper part of the  urethra. The urethra is a tube that carries urine and semen through the penis to the outside of the body. The prostate gland is found only in men. It produces semen, the liquid that carries the sperm when a man ejaculates.    Prostate cancer starts in the cells of the prostate gland. When a man has prostate cancer, the prostate cancer cells grow into a tumor. These cancer cells can also spread into other parts of the body.    The most common symptom of prostate cancer is no symptom at all.     Risk Factors and Prevention:    While 1 man out of 6 will be diagnosed with prostate cancer during his lifetime, only 27 man in 25 will die of this disease. The death rate for prostate cancer is going down. And the disease is being found earlier as well.     Prostate cancer is a disease that is often associated with aging. About 61 percent of men are aged 28 years and older when diagnosed with prostate cancer.     African American men are at especially high risk for prostate cancer: They are over 65 percent more likely to develop this disease than non-Hispanic white men. African American men are twice as likely to die of this disease. Prostate cancer occurs less often in Asian men than  in whites.    Men with close family members (father or brother) who have had prostate cancer are more likely to get it themselves, especially if their relatives were young when they got the disease.     In [place], approximately [phone] men will be diagnosed with prostate cancer in 2007, 3,010 men will die from it and 114,600 men will continue living with it.     Because we don't know the exact cause of prostate cancer, it is not possible to give good advice about prevention. Some experts think that diet can reduce the risk of developing prostate cancer by eating less red meat and fat, and eating more fruits, vegetables and grains.    Prostate Cancer Screening Options:    Prostate cancer can often be found early by testing the amount of  Prostate-Specific Antigen (PSA) in a man's blood. PSA is a substance made by the normal prostate gland. Although PSA is mostly found in semen, a small amount is also found in the blood. There is no question that the PSA test can help spot prostate cancer. But it can't tell how dangerous the cancer is.    Another way to find prostate cancer is the Digital Rectal Exam (DRE). To do the DRE, the doctor inserts a gloved, lubricated finger into the rectum to feel for any irregular or firm areas that might be cancer. The DRE is less effective than the PSA blood test in finding prostate cancer, but it can sometimes find cancers in men with normal PSA levels. For this reason, the [place] (ACS) recommends that when prostate cancer screening is done, both the DRE and the PSA should be used.     These tests are not always accurate, however. Wrong test results could lead to excess worry, or even an unneeded biopsy or other tests. Until more is known, one should talk to his doctor about whether or not he needs to be tested. Things to take into account are age and health. If one is young and gets prostate cancer, it will probably shorten his life if it is not caught early. But if one is older or in poor health, then prostate cancer may never become a major problem because it often grows so slowly.     ACS believes that doctors should offer the PSA blood test and DRE yearly, beginning at age 82 to men who do not have any major medical problems and can be expected to live at least 10 more years. Men at high risk should begin testing at age 65. Men at high risk include African Americans and men who have a close relative who had prostate cancer before age 106. Men at even higher risk (have several close relatives with prostate cancer at an early age) could begin testing at age 41.     If certain symptoms or the results of early tests suggest one might have prostate cancer, the doctor will use further tests to find out  whether the disease is present.    The prostate biopsy is the only way to know for sure if a man has prostate cancer. During a biopsy, tissue from a man's prostate is removed so it can be sent to the lab to see if there are cancer cells. A core needle biopsy is the main method used.    Cancer Treatment:    Most of the time, prostate cancer grows slowly. Autopsy studies show that many older men who died of other diseases also had  prostate cancer that neither they nor their doctor were aware of. But sometimes prostate cancer can grow and spread quickly. Even with the latest methods, it is hard to tell which prostate cancers will grow slowly and which will grow quickly.     If cancer is present, the biopsy sample will be graded. Grading the cancer helps to predict how fast the cancer is likely to grow and spread. A staging system is a way to describe the extent to which the cancer has spread.    Surgery, radiation, and hormone therapy are the most common treatments for prostate cancer. Chemotherapy may be used in some cases, and watchful waiting, though not an active form of treatment, may also be used.    There are risks and significant side effects with all forms of treatment for prostate cancer. They include impotence, sterility, and incontinence.    Several factors should be taken into account before one chooses a course of treatment. These factors include age, overall health, goals for treatment, and one's feelings about side effects, risks and complications of treatment.. Some men, for example, can't imagine living with side effects such as incontinence or impotence. Others are less concerned about these and more concerned about getting rid of the cancer.  It is often helpful to discuss treatment options with more than one doctor. Many men find that talking to others who have faced the same issues is helpful.    Prostate Cancer Survival Rates:    Overall, 70 percent of men diagnosed with prostate cancer survive  at least 5 years. Ninety one percent of all prostate cancers are found while they are still within the prostate or only in nearby areas. Nearly 100 percent of these men survive at least 5 years. For the men whose cancer has already spread to distant parts of the body when it is found, 34 percent will survive at least 5 years.    Sources:  1. American Cancer Society, Overview: Prostate Cancer. Available @ ClassMovie.be. Idelle Leech:  January 2007  2. IMPACT, What Every Man Should Know About Prostate Cancer? Brochure.  Available @ http://www.[place]-impact.org/public/genlbro.pdf . Accessed: January 2007.  [place], [employer], and [employer], [employer], [place], [place], [place]. September 2006

## 2016-11-16 ENCOUNTER — Ambulatory Visit: Payer: Commercial Managed Care - PPO | Admitting: Dermatology

## 2016-11-16 ENCOUNTER — Encounter: Payer: Self-pay | Admitting: Dermatology

## 2016-11-16 DIAGNOSIS — L853 Xerosis cutis: Secondary | ICD-10-CM

## 2016-11-16 DIAGNOSIS — Z85820 Personal history of malignant melanoma of skin: Principal | ICD-10-CM

## 2016-11-16 DIAGNOSIS — Z1283 Encounter for screening for malignant neoplasm of skin: Secondary | ICD-10-CM

## 2016-11-16 DIAGNOSIS — D2239 Melanocytic nevi of other parts of face: Secondary | ICD-10-CM

## 2016-11-16 DIAGNOSIS — D225 Melanocytic nevi of trunk: Secondary | ICD-10-CM

## 2016-11-16 DIAGNOSIS — Z86008 Personal history of in-situ neoplasm of other site: Secondary | ICD-10-CM

## 2016-11-16 DIAGNOSIS — L905 Scar conditions and fibrosis of skin: Secondary | ICD-10-CM

## 2016-11-16 NOTE — Patient Instructions (Signed)
Dry Skin Care    Take short, showers (avoid hot water; it takes off more natural oils)  Use a mild soap (eg. Dove, Cetaphil, Neutrogena)  No soap should be used in a bathtub because then soap will end up all     over the body.   Use soap only on areas truly needed (underarms,groin,buttocks,fold areas, feet, face, hair)  Alternatives to soap include Aquanil and Cetaphil cleansers.  Pat off excess water and put moisturizer on immediately (within 3 min.)  I prefer moisturizing creams ( in tub or jar) over lotions.  Good choices include:  1. Cetaphil cream  2. Vanicream  3. Aveeno cream  4. Eucerin cream  5. Aquaphor ointment  A cream should always be applied after showering or bathing, but may be applied as many additional times as is necessary.     Mix moisturizing cream with small amount of Vaseline and apply to dry areas daily.

## 2016-11-16 NOTE — Progress Notes (Signed)
Dustin Ion, MD  [place] [address]    11/16/2016    Dear Dr. Gordy Savers Riggle:    I had the pleasure of seeing your patient Dustin Ibarra, a 49yr  male at your request in consultation for the following:    Chief Complaint: The patient's chief concern is "skin check".  Please see integrated history, exam, diagnosis, and plan below.     Past Medical History: There is  personal history of skin cancer. 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     Allergies: Pcn (penicillin) [penicillins]    Family History: There is a family history of skin cancer: melanoma    Social History: The patient does not  smoke or use tobacco products. Reviewed in EMR 11/16/2016.  Occupation: Public librarian    Questionnaire dated 11/16/2016 reviewed by me 11/16/2016.    Review of Systems: The patient has been asked about current: persistent fever, fatigue, night sweats, significant decrease in energy, unintentional weight loss, weakness, dizziness, mouth sores, bleeding gums, visual changes, persistent cough, wheezing, shortness of breath, sinus problems, chest pain, abdominal pain, nausea, vomiting, diarrhea, joint pain, muscle pain, headaches, pain with urination, heat intolerance, cold intolerance, anxiety, depression, excessive bleeding  Positives are: none. All other ROS are negative.     Physical Examination:    Labs/Imaging below reviewed 11/16/2016 by me:    Lab Results   Lab Name Value Date/Time    WBC 4.6 10/09/2013 08:03 AM    HGB 14.9 10/09/2013 08:03 AM    HCT 44.4 10/09/2013 08:03 AM    PLT 224 10/09/2013 08:03 AM     Lab Results   Lab Name Value Date/Time    NA 137 10/09/2013 08:03 AM    K 3.9 10/09/2013 08:03 AM    CL 104 10/09/2013 08:03 AM    CO2 25 10/09/2013 08:03 AM    BUN 11  10/09/2013 08:03 AM    CR 0.82 10/09/2013 08:03 AM    GLU 85 10/09/2013 08:03 AM      Lab Results   Lab Name Value Date/Time    AST 23 10/09/2013 08:03 AM    ALT 21 10/09/2013 08:03 AM    ALP 64 10/09/2013 08:03 AM    ALB 4.2 10/09/2013 08:03 AM    TP 7.0 10/09/2013 08:03 AM    TBIL 0.8 10/09/2013 08:03 AM       Lab Results   Lab Name Value Date/Time    TSH 1.19 10/09/2013 08:03 AM      Lab Results   Lab Name Value Date/Time    CHOL 224 (H) 10/09/2013 08:03 AM    LDLC 145 (H) 10/09/2013 08:03 AM    HDL 61 10/09/2013 08:03 AM    TRIG 90 10/09/2013 08:03 AM       There were no vitals taken for this visit.    A full body skin exam was done relating to prior skin cancer, moles/pigmented lesions, skin photodamage.    On physical examination, the patient is well developed, well nourished, and pleasant with normal affect. The patient is alert and oriented to time, place, and person . The following areas were examined today: Head/scalp including palpation, face, eyes, eyelids and conjunctiva, nose, mouth/oral mucosa, lips, neck including palpation, chest (  including the breasts and axillae), abdomen, buttocks/groin, back, right and left upper extremities, digit, nail, and the right and left lower extremities, digits, and nails. Digits/nails were inspected and palpated (for clubbing, cyanosis, inflammation). The lower extremities were inspected for peripheral swelling, varicosities. Lymph node basins were palpated in the neck. Last full skin exam with dermatology was back in 03/2009.     The above areas were inspected and the findings were normal except for the following pertinent abnormal findings listed in the integrated HPI/Exam/Assessment/Plan section below:     Impression/Problems/Plan:     Dx: Scar. History of melanoma in situ on L posterior shoulder s/p excision Jan 2009  HPI: No fever, chills, weight loss or fatigue. Site is healing well.  PE: Well healed scar without evidence of recurrence. No lymphadenopathy  Plan:  The nature of sun-induced photo-aging and skin cancers is discussed.  Sun avoidance, protective clothing, and the use of 30-SPF sunscreens is advised. Observe closely for skin damage/changes, and call if such occurs.     Dx: Xerosis  HPI: Itching and dryness, diffuse. This was first noticed several months ago. Symptoms include none.  Prior treatments include none. The patient reports the following modifiers: none.  PE: Several patches of dry skin located on the trunk and extremities  Plan: Reviewed dry skin care including short, lukewarm baths (<5 minutes/day) and generous application of mild, hypoallergenic emollients in cream form (such as Aveeno, Cetaphil, Eucerin or Aquaphor) within 3 minutes of bathing and several times throughout the day as tolerated.  Avoid dryer sheets or fabric softeners.  Use ALL Free & Clear detergent.  Mix moisturizing cream with small amount of Vaseline and apply to entire body daily.     Dx: Benign nevi  HPI: Skin growth located on the trunk and extremities. This was first noticed several years ago. It is not growing in size. No bleeding, ulceration, pain or itch. No prior treatment or biopsy. No known modifiers.  PE: Multiple tan to brown macules and papules with even border and color and benign pigment network under epiluminescence microscopy on the face, trunk, upper extremities, and lower extremities.   Plan: Pt was encouraged to perform self-skin examination approximately once a month to observe any changes in these currently benign-appearing nevi.        Photoprotection/skin cancer review:  The nature of sun-induced photo-aging and skin cancers is discussed.  Sun avoidance, protective clothing, and the use of 30-SPF sunscreens is advised. Observe closely for skin damage/changes, and call if such occurs.  Medication Review:   -- I discussed risks, benefits, and proper use of the above dermatological medications with the patient. Patient verbalized understanding of the proper use  of dermatological medications and gave verbal consent prior to beginning treatment as outlined above.   Education needs were addressed and there were no barriers to learning.     15 minutes were spent in this encounter and over 50% of this time was spent counseling the patient.     Follow up: [facility] in a year.     SCRIBE DISCLAIMER:   IJeraldine Loots, a trained medical scribe, scribed this noted in the presence of Edgardo Roys, MD.   Electronically signed by Jeraldine Loots, SCRIBE, 11/16/2016  1:05 PM    PROVIDER DISCLAIMER:  This document serves as my personal record of services that I personally performed and was taken in my presence. It was created on 11/16/2016 on my behalf by the medical scribe noted above.  I have reviewed this document and agree that this note accurately and completely reflects the history and exam findings, the patient care provided, and my medical decision making.    Electronically signed by:        Nunzio Cobbs, MD  Assistant Clinical Professor  [employer]   Department of Dermatology  PI # [phone]

## 2016-11-16 NOTE — Nursing Note (Signed)
Vital signs taken, allergies verified, and screened for pain.    Tamatha Gadbois, MA

## 2017-03-19 ENCOUNTER — Ambulatory Visit: Payer: Commercial Managed Care - PPO | Attending: Urology | Admitting: Urology

## 2017-03-19 VITALS — BP 144/95 | HR 99 | Temp 98.5°F | Resp 16

## 2017-03-19 DIAGNOSIS — Z3009 Encounter for other general counseling and advice on contraception: Principal | ICD-10-CM | POA: Insufficient documentation

## 2017-03-19 NOTE — Progress Notes (Signed)
NEW PATIENT VISIT       [name] is a y.o. 56yr male who presents to me a history of fecundity anxiety and seeks a vasectomy.  [name] states that he currently has 1 children and he and his GF (55yo) desire none.    I had a discussion with [name] regarding the risks, benefits, and alternatives to the procedure and would like to proceed at this time.  [name] understands that he must have at least 1 negative semen analysis (at 3 months after vasectomy) before having unprotected intercourse in order to decrease his chances to conceiving a child.       I also discussed cryopreservation of a semen sample before vasectomy. He dos not want to proceed with this.       Past Medical History:   Diagnosis Date    Insomnia, unspecified 10/18/2006    Other diseases of nasal cavity and sinuses(478.19) 10/18/2006    Pain in limb 10/18/2006    right foot    Varicella without mention of complication      Past Surgical History:   Procedure Laterality Date    PR APPENDECTOMY  1968     Current Outpatient Prescriptions on File Prior to Visit   Medication Sig Dispense Refill    Fluticasone (FLONASE) 50 mcg/actuation nasal spray Instill 1 spray into EACH nostril every day. 48 g 3     No current facility-administered medications on file prior to visit.        Family history reviewed:  None relevant to this visit    Social History   Substance Use Topics    Smoking status: Never Smoker    Smokeless tobacco: Never Used    Alcohol use Yes      Comment: 6 per week         Review of Systems:  GENERAL: Denies recent unwanted weight changes, fevers, lesions  INTEGUMENTARY: Denies recent unexplained rashes, eruptions  EYES:Denies double vision, field defects  EARS, NOSE, MOUTH AND THROAT:Denies new oral lesions  RESPIRATORY: Denies new chest pain, wheezing, cough, difficulty breathing  CARDIOVASCULAR: Denies chest pain or uncontrolled/untreated high blood pressure  ENDOCRINE: Denies thyroid troubles or history of  pituitary abnormalities  GASTROINTESTINAL:Denies significant nausea, vomiting, diarrhea, severe constipation  GENITOURINARY: As per HPI  HEMATOLOGIC/LYMPHATIC: Denies easy bruising or bleeding    All other systems were negative.       Physical Exam:    GENERAL: Well developed, well nourished, in no acute distress  CHEST: Normal breathing with equal chest rise. No gynecomastia  ABDOMEN: Abdomen soft and non-tender without masses   BACK: No CVAT  GENITALIA: Testis descended bilaterally, , vas deferens and epididymus palpable bilaterally, Phallus: no lesions    EXTREMITIES: No clubbing, cyanosis, edema, or deformity    SKIN: Genital and lower body skin intact without lesions or rashes  NEURO: No focal deficits  PSYCH: Patient alert and oriented x3. Alert and cooperative; normal mood and affect; normal attention span     Assessment:   -Desires vasectomy: scrotum slightly tight    Plan:  -vasectomy in clinc    The following concerns about vasectomy were discussed with the patient     Vasectomy is intended to be a permanent form of contraception.    Vasectomy does not produce immediate sterility.     Following vasectomy, another form of contraception is required until vas occlusion is confirmed by post- vasectomy semen analysis (PVSA).    Even after vas  occlusion is confirmed, vasectomy is not 100% reliable in preventing pregnancy - spontaneous reversal with pregnancy is a rare but real possibility.      The risk of pregnancy after vasectomy is approximately 1 in 2,000 for men who have post-vasectomy azoospermia or PVSA showing rare non-motile sperm (RNMS).    Repeat vasectomy is necessary in ?1% of vasectomies, provided that a technique for vas occlusion known to have a low occlusive failure rate has been used.    Patients should refrain from ejaculation for approximately one week after vasectomy.    Options for fertility after vasectomy include vasectomy reversal and sperm retrieval with in vitro fertilization.      These options are not always successful, and they may be expensive.    The rates of surgical complications such as symptomatic hematoma and infection are 1-2%. These rates vary with the surgeon's experience and the criteria used to diagnose these conditions.    There have been rare cases of spontaneous return of sperm in the ejaculate beyond 9 months after vasectomy despite a semen analysis showing no sperm at some point after vasectomy.    Chronic scrotal pain associated with negative impact on quality of life occurs after vasectomy in about 1-2% of men. Few of these men require additional surgery.Other permanent and non-permanent alternatives to vasectomy are available.    Finally I discussed that it is likely he will develop anti-sperm antibodies and that this does not seem to increase the development of any future chronic disease.    I spent 30 minutes with the patient, more than 50% of that time was spent counseling the patient on all aspects of the above findings and plan.  And the patient agrees to the plan.

## 2017-03-19 NOTE — Nursing Note (Signed)
Vital signs taken, allergies verified, screened for pain, med hx taken (if appropriate).  Joretta Bachelor, MA  BP X 2. Pt states no Sx.MD aware.

## 2017-05-21 ENCOUNTER — Ambulatory Visit: Payer: Commercial Managed Care - PPO | Admitting: Urology

## 2017-07-02 ENCOUNTER — Ambulatory Visit: Payer: Commercial Managed Care - PPO | Attending: Anatomic Pathology & Clinical Pathology | Admitting: Urology

## 2017-07-02 VITALS — BP 153/101 | HR 91 | Temp 98.9°F

## 2017-07-02 DIAGNOSIS — Z302 Encounter for sterilization: Principal | ICD-10-CM | POA: Insufficient documentation

## 2017-07-02 NOTE — Nursing Note (Signed)
Dustin Ibarra : 56yr old male who presents in clinic today for a Bilateral Vasectomy.     Two patient identifiers verified, vital signs taken, chief complaint identified, drug allergies verified, screened for pain, pharmacy confirmed. No acute distress noted at this time.    Allergies verified: Lidocaine : no, Betadine : no, Latex : no, Cipro :no, Sulfa :  no.      Discontinued Blood Thinners for 7 days prior: yes. Brought a jockstrap: yes. Shaved and showered prior to procedure: yes.     Initial vasectomy consult consent form signed and on file. Secondary consent signed today by the patient and MD. Patient prepped for vasectomy per clinic protocol. Patient prepped with betadine solution and covered with sterile drapes.    Pre-Procedure Checklist - Short Version    ID verified by two sources (select any two from list): DOB and Name  Site: testes (vas deferens)  Procedure: bilateral vasectomy  Surgical/Procedure pause: Yes    Site(s) marked: N/A  Position: N/A  Consent: Available; reviewed by MD  Pre - Sedation H&P: Non-Sedation  History and Physical: Available; reviewed by MD  Other relevant documentation:  N/A  Relevant images:  N/A  Implants: N/A  Special Equipment:  N/A  Blood products matched:  N/A  Other pre-procedural information:  N/A  Patient concurs: Yes  Team present and concurs:  Single provider procedure    Post Procedure:    Gave patient instructions on post-op care as well as where and when to drop off his post-vasectomy semen analysis along with a sterile sample cup with his information on it. Also gave patient an ice pack and advised to ice on and off every 15 minutes for the next few hours. Patient expresses understanding. Patient was taught these instructions using the teach back method.    Left and Right Vas Deferens sent to pathology.    Deedra Ehrich, LVN 10:56 AM 07/02/17    Patients BP is elevated due to unknown reasons. Patients BP was rechecked and remained elevated.  Patient is A&O X3,  respirations are easy and even, moves all extremities well, has a steady gait, skin is pink warm and dry. Patient was directed to contact his/her PCP to follow up regarding elevated BP. Patient was taught these instructions using the teach back method. MD notified.

## 2017-07-02 NOTE — Procedures (Signed)
Vasectomy    Pre-Procedure Diagnosis: Sterilization     Post-Procedure Diagnosis: Same     Procedure Performed: Bilateral partial vasectomy    Surgeon: R. Briggitte Boline     EBL: <5cc     Complications: None     Specimens: Bilateral vasal segments, sent to pathology     Anesthesia: Local anesthesia    Procedure in Detail:   After obtaining informed consent, the patient was preped and draped in the standard sterile fashionion in the lower abdominal and genitalia region.     Starting on the patient's right hemiscrotum, the vas deferens was identified and isolated. 1% lidocaine was used to anesthetize the skin and the perivasal tissue. A no-scalpel approach was used and the vas deferens was isolated with a vas clamp. The vasal tissue was carefully separated and the vas deferens was brought out through the wound. The middle segment of vas deferens was then excised and passed off of the field. The mucosa of the exposed lumens on each side of the vas were cauterized. 3-0 silk suture ties were placed on both ends of the vas deferens.   After obtaining excellent hemostasis, the proximal vasal end was put back into the scrotum an interposition flap of spermatic fascia interposed by tying the distal end of the vas deferens with a flap over it, again with a 3-0chromic. A suture tail was left for the end of the procedure to tug on to confirm we worked on vas deferens from both sides and not just from one. An identical procedure was performed on the patients left side.  Tails of suture left long on each side were gently pulled on to confirm we transected segments of both vas. Once confirmed the tails of the suture were cut. The wounds were then cleaned and draped with sterile gauze. Scrotal support and fluffs were applied. He tolerated the procedure well and was sent home in stable condition. I gave him strict instructions that he must have at least one negative semen sample before engaging in unprotected intercourse or he  is at risk for achieving a pregnancy. He will follow up in 3 months.

## 2017-07-05 LAB — SURGICAL PATHOLOGY

## 2017-07-07 ENCOUNTER — Telehealth: Payer: Self-pay | Admitting: Urology

## 2017-07-07 NOTE — Telephone Encounter (Signed)
Patient still dealing with some pain and discomfort when he walks from vas procedure. Wants to know if it is a concern

## 2017-07-07 NOTE — Telephone Encounter (Signed)
Patient identified by name, date of birth, and address.   Patient of Dr. Hart Robinsons  Last seen: 07/02/17  Diagnosis: desires vasectomy  Plan:  S/p 07/02/17 Vasectomy  Spoke with patient  Calling regarding:denies fever. Site looks good. Area is swollen after driving to [facility] and going to dinner. Patient is wearing briefs for support.   Advised to continue to ice and elevate for discomfort. May take Ibuprofen as its been 5 days since procedure.   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 Advised to see how he feels and let us know on Monday if unable to report to work.    Thornton Dales, RN  Clinical Triage Nurse  Department of Urologic Surgery  [phone]               '

## 2017-11-09 ENCOUNTER — Ambulatory Visit: Payer: Commercial Managed Care - PPO | Attending: Urology

## 2017-11-09 DIAGNOSIS — Z302 Encounter for sterilization: Principal | ICD-10-CM | POA: Insufficient documentation

## 2017-11-09 LAB — SEMEN EVAL, POST VASECTOMY

## 2017-11-10 ENCOUNTER — Other Ambulatory Visit: Payer: Self-pay | Admitting: Urology

## 2017-11-10 ENCOUNTER — Encounter: Payer: Self-pay | Admitting: Urology

## 2017-11-10 DIAGNOSIS — Z9852 Vasectomy status: Principal | ICD-10-CM

## 2018-01-25 ENCOUNTER — Telehealth: Payer: Self-pay | Admitting: FAMILY PRACTICE

## 2018-01-25 ENCOUNTER — Ambulatory Visit: Payer: Commercial Managed Care - PPO | Attending: Family Medicine | Admitting: Family Medicine

## 2018-01-25 VITALS — BP 144/86 | HR 116 | Temp 104.3°F | Resp 16 | Wt 190.0 lb

## 2018-01-25 DIAGNOSIS — Z1389 Encounter for screening for other disorder: Secondary | ICD-10-CM

## 2018-01-25 DIAGNOSIS — E7800 Pure hypercholesterolemia, unspecified: Principal | ICD-10-CM

## 2018-01-25 DIAGNOSIS — Z125 Encounter for screening for malignant neoplasm of prostate: Secondary | ICD-10-CM

## 2018-01-25 DIAGNOSIS — E559 Vitamin D deficiency, unspecified: Secondary | ICD-10-CM

## 2018-01-25 DIAGNOSIS — R509 Fever, unspecified: Principal | ICD-10-CM | POA: Insufficient documentation

## 2018-01-25 LAB — POC RAPID STREP A: POC RAPID STREP A: NEGATIVE

## 2018-01-25 LAB — POC FLU TEST: POC FLU TEST: NEGATIVE

## 2018-01-25 MED ORDER — ACETAMINOPHEN 500 MG TABLET
1000.0000 mg | ORAL_TABLET | Freq: Once | ORAL | 0 refills | Status: AC
Start: 2018-01-25 — End: 2019-01-20

## 2018-01-25 NOTE — Telephone Encounter (Signed)
Labs ordered.  Notify patient.

## 2018-01-25 NOTE — Telephone Encounter (Signed)
Patient states 1 year ago Dr. Jeris Penta had ordered some labs for patient to have done but patient did not have them done. Patient would like to know if Dr. Jeris Penta can please reorder the labs since they are expired. Please advise.    Dustin Ibarra  [facility] II

## 2018-01-25 NOTE — Telephone Encounter (Signed)
Please let Dustin Ibarra know his strep and flu were neg.  I am ordering resp viral panel and strep culture.  He should in the mean time continue hydration with pedialyte and take tylenol and/or ibuprofen for pain.

## 2018-01-25 NOTE — Patient Instructions (Addendum)
Please use Pedialyte or other oral rehydration solution for children. Use this instead of water until diarrhea and/or vomiting resolves. Do not use Gatorade or other sports drinks, they are too sugary and the minerals are too concentrated which may cause worsening of diarrhea.    Buy an earwax kit containing softening drops and a small bulb syringe over the counter. Debrox makes a good one.  Use several drops in affected ear canal. Lay on opposite side for about 15 minutes.   Then try flushing with baby bulb syringe with WARM water (do not use cold or hot water as this can cause pain and/or dizziness). It is easiest to do this in the shower. Place a mug in the shower to fill with warm water for filling the bulb syringe.  Repeat the above steps daily if problem continues.  If you are not able to remove the wax after several days of treatment, please schedule an appointment for removal in the office.  If wax build up is an ongoing issue for you I recommend flushing the ears once a week.

## 2018-01-25 NOTE — Progress Notes (Signed)
Chief Complaint   Patient presents with    Fever    Letter      SUBJECTIVE:   Dustin Ibarra is a 57yr old male who presents for symptoms of upper respiratory infection.  Sick for 2 days   No Nasal congestion  Sore throat    Ear pain no  Cough no  Headache yes, frontal  Body aches yes   Fever yes   Fatigue yes  No nausea, vomiting, diarrhea.    Problem List:  Patient Active Problem List   Diagnosis    Pain in limb    Insomnia    Other diseases of nasal cavity and sinuses(478.19)    Other and unspecified nonspecific immunological findings    Melanoma in situ ([place])    Sleep apnea    Vitamin D deficiency    High cholesterol       Fam Hx:  family history includes Cancer in his mother; Heart in his mother; Hypertension in his father; Stroke in his father.    Social Hx:   reports that he has never smoked. He has never used smokeless tobacco. He reports that he drinks alcohol. He reports that he does not use drugs.    Medications:  Current Outpatient Medications   Medication Sig    Fluticasone (FLONASE) 50 mcg/actuation nasal spray Instill 1 spray into EACH nostril every day.     No current facility-administered medications for this visit.        ROS:   Constitutional: yes fever  Eyes: negative.  Ears, Nose, Mouth, Throat: URI symptoms as above  CV: negative.  Resp: no cough  GI: negative.  GU: negative.  Musculoskeletal: negative.  Integumentary: negative.  Neuropsych: negative.    Objective:  BP 144/86 (SITE: right arm, Orthostatic Position: sitting, Cuff Size: regular)   Pulse 116   Temp (!) 40.2 C (104.3 F) (Temporal)   Resp 16   Wt 86.2 kg (190 lb 0.6 oz)   BMI 28.47 kg/m    Physical Exam   Constitutional:   The patient is well developed, well nourished, alert, pleasant and cooperative.    HENT:   Head: The head is normocephalic and atraumatic. Sinuses are non-tender to palpation.  ENT: The external ears are normal upon inspection, external auditory canals are clear bilaterally, tympanic membranes are  normal in appearance bilaterally. The nasal mucosa is erythematous and edematous, oropharynx moderately erythematous and with post nasal drip.    Eyes:   The conjunctiva are clear.  Neck:   The neck is supple. Mild anterior bilateral cervical lymphadenopathy.    Cardiovascular:   The heart rate and rhythm are regular. There are no murmurs, gallops or rubs. There is no pedal edema.   Pulmonary/Chest:   The respirations are unlabored, breath sounds are clear to auscultation in all fields with no rhonchi, rales or wheezes.   Skin:   The skin is warm and dry without rashes or lesions.   Psychiatric:   The mood is neutral and affect is congruent. The speech is normal cadence. The thought processes appear normal.     Data:  POC strep: neg  POC flu: neg    Assessment/Plan: (R50.9) Febrile illness  (primary encounter diagnosis)  Comment: unclear etiology. Presumed viral. Instructed to return if not improving over next 2 days or if worse.   Plan: hydrate with pedialyte, tylenol/ibuprofen prn fever. RESPIRATORY         VIRAL PANEL, CULTURE BETA STREP ONLY  I did review patient's past medical and family/social history, no changes noted.   Barriers to Learning assessed: none. Patient verbalizes understanding of teaching and instructions.     Electronically signed by:   Myriam Forehand DO  Associate Physician  [place], [facility]  01/25/2018

## 2018-01-25 NOTE — Telephone Encounter (Signed)
Pt notified as per Dr. Jeris Penta and Dr. Ellamae Sia.

## 2018-01-25 NOTE — Nursing Note (Signed)
Patient ID confirmed using 2 identifiers.    Vital signs taken, allergies verified, screened for pain, and med history taken.     Dustin Ibarra/ MA

## 2018-01-26 LAB — RESPIRATORY VIRUS PANEL  - MOLECULAR
ADENOVIRUS: NEGATIVE
CHLAMYDOPHILIA PNEUMONIAE: NEGATIVE
CORONAVIRUS: NEGATIVE
HUMAN METAPNEUMOVIRUS: NEGATIVE
INFLUENZA A: NEGATIVE
INFLUENZA B: NEGATIVE
MYCOPLASMA PNEUMONIAE: NEGATIVE
PARAINFLUENZA: NEGATIVE
RESPIRATORY SYNCYTIAL VIRUS: NEGATIVE
RHINOVIRUS: NEGATIVE

## 2018-01-29 LAB — CULTURE BETA STREP ONLY

## 2018-01-31 ENCOUNTER — Telehealth: Payer: Self-pay | Admitting: FAMILY PRACTICE

## 2018-01-31 NOTE — Telephone Encounter (Signed)
Patient has been informed of MA message below. He has no further questions.     Thank You    Curtis Sites, [place]

## 2018-01-31 NOTE — Telephone Encounter (Signed)
Patient calling to request appt with Riggle today. I advised PCP not in office and offered to schedule with another provider but he declined stating he saw Dr Valetta Close last week and he still had to go to [facility] ER on 01/27/18 due to his pneumonia and temp of 104. He would also like to discuss a few other issues he would not disclose  I offered appt with Dr Jeris Penta for tomorrow 02/01/18 but patient declined stating he will be away. He asked for next availabe MOnday appt (which was in Aug except same day appt and I advised the same days can not be used until the day of) patient declined Aug appt and stated "I will just come in on a Monday coming up and Dr Jeris Penta can work him in. I have known him a long time" (again I advised PCP out next two Mondays.)    Thank you,  Amy Stilwell, [facility] II

## 2018-01-31 NOTE — Telephone Encounter (Signed)
Returned call. No answer and voice mail was full. Unable to leave a message. Pt can be offered 03/07/18 or 03/21/18 urgent slot. Just because this patient has known Dr. Jeris Penta a "long time" does not give him the right to walk in and expect to be seen. Also, Dr. Jeris Penta will be out of the office for the next few Mondays. The patient can take what is offered or he can arrange an appointment at his convenience with an available provider.

## 2018-02-21 ENCOUNTER — Telehealth: Payer: Self-pay | Admitting: FAMILY PRACTICE

## 2018-02-21 ENCOUNTER — Ambulatory Visit: Payer: Commercial Managed Care - PPO | Attending: FAMILY PRACTICE

## 2018-02-21 DIAGNOSIS — Z125 Encounter for screening for malignant neoplasm of prostate: Secondary | ICD-10-CM | POA: Insufficient documentation

## 2018-02-21 DIAGNOSIS — Z87448 Personal history of other diseases of urinary system: Principal | ICD-10-CM

## 2018-02-21 DIAGNOSIS — E559 Vitamin D deficiency, unspecified: Secondary | ICD-10-CM | POA: Insufficient documentation

## 2018-02-21 DIAGNOSIS — Z1389 Encounter for screening for other disorder: Principal | ICD-10-CM | POA: Insufficient documentation

## 2018-02-21 DIAGNOSIS — E7800 Pure hypercholesterolemia, unspecified: Secondary | ICD-10-CM | POA: Insufficient documentation

## 2018-02-21 LAB — CBC WITH DIFFERENTIAL
BASOPHILS % AUTO: 0.7 %
BASOPHILS ABS AUTO: 0 10*3/uL (ref 0.0–0.2)
EOSINOPHIL % AUTO: 5.8 %
EOSINOPHIL ABS AUTO: 0.3 10*3/uL (ref 0.0–0.5)
HEMATOCRIT: 40.3 % — AB (ref 41.0–53.0)
HEMOGLOBIN: 13.9 g/dL (ref 13.5–17.5)
LYMPHOCYTE ABS AUTO: 1.4 10*3/uL (ref 1.0–4.8)
LYMPHOCYTES % AUTO: 26.4 %
MCH: 31.3 pg (ref 27.0–33.0)
MCHC: 34.5 % (ref 32.0–36.0)
MCV: 90.7 fL (ref 80.0–100.0)
MONOCYTES % AUTO: 9 %
MONOCYTES ABS AUTO: 0.5 10*3/uL (ref 0.1–0.8)
MPV: 7.3 fL (ref 6.8–10.0)
NEUTROPHIL ABS AUTO: 3 10*3/uL (ref 1.8–7.7)
NEUTROPHILS % AUTO: 58.1 %
PLATELET COUNT: 309 10*3/uL (ref 130–400)
RDW: 12.7 % (ref 0.0–14.7)
RED CELL COUNT: 4.44 10*6/uL — AB (ref 4.50–5.90)
WHITE BLOOD CELL COUNT: 5.2 10*3/uL (ref 4.5–11.0)

## 2018-02-21 LAB — COMPREHENSIVE METABOLIC PANEL
ALANINE TRANSFERASE (ALT): 25 U/L (ref 6–63)
ALBUMIN: 3.5 g/dL (ref 3.4–4.8)
ASPARTATE TRANSAMINASE (AST): 22 U/L (ref 15–43)
Alkaline Phosphatase (ALP): 96 U/L (ref 35–115)
BILIRUBIN TOTAL: 0.6 mg/dL (ref 0.3–1.3)
CALCIUM: 9.2 mg/dL (ref 8.6–10.5)
CARBON DIOXIDE TOTAL: 25 mmol/L (ref 24–32)
CHLORIDE: 100 mmol/L (ref 95–110)
CREATININE BLOOD: 0.84 mg/dL (ref 0.44–1.27)
GLUCOSE: 89 mg/dL (ref 70–99)
POTASSIUM: 4.5 mmol/L (ref 3.3–5.0)
PROTEIN: 6.9 g/dL (ref 6.3–8.3)
SODIUM: 135 mmol/L (ref 135–145)
UREA NITROGEN, BLOOD (BUN): 7 mg/dL — AB (ref 8–22)

## 2018-02-21 LAB — URINALYSIS AND CULTURE IF IND
BILIRUBIN URINE: NEGATIVE
GLUCOSE URINE: NEGATIVE mg/dL
KETONES: NEGATIVE mg/dL
LEUK. ESTERASE: NEGATIVE
NITRITE URINE: NEGATIVE
PH URINE: 7 (ref 4.8–7.8)
PROTEIN URINE: NEGATIVE mg/dL
RBC: 2 /HPF (ref 0–5)
SPECIFIC GRAVITY, URINE: 1.005 (ref 1.002–1.030)
UROBILINOGEN.: NEGATIVE mg/dL (ref ?–2.0)

## 2018-02-21 LAB — LIPID PANEL WITH DLDL REFLEX
CHOLESTEROL: 185 mg/dL (ref 0–200)
HDL CHOLESTEROL: 40 mg/dL (ref >=35)
LDL CHOLESTEROL CALCULATION: 122 mg/dL (ref <130)
NON-HDL CHOLESTEROL: 145 mg/dL (ref <=160)
TOTAL CHOLESTEROL:HDL RATIO: 4.6 — AB (ref <4.0)
TRIGLYCERIDE: 113 mg/dL (ref 35–160)

## 2018-02-21 LAB — PSA SCREEN: PSA Screen: 0.5 ng/mL (ref 0.0–4.0)

## 2018-02-21 LAB — VITAMIN D, 25 HYDROXY: VITAMIN D, 25 HYDROXY: 28 ng/mL (ref 20.0–50.0)

## 2018-02-21 LAB — TSH WITH FREE T4 REFLEX: THYROID STIMULATING HORMONE: 1.08 u[IU]/mL (ref 0.35–3.30)

## 2018-02-21 NOTE — Telephone Encounter (Signed)
Pt states that there should be a urine test ordered due to having some blood in urine.  He left a sample in the lab and will hold until end of day if order is needed.    Thanks,    Eula Fried, CPT II

## 2018-02-21 NOTE — Telephone Encounter (Signed)
I ordered urine studies.

## 2018-02-21 NOTE — Telephone Encounter (Signed)
Patient left notes from from [facility] visit 01/27/18    Thank you,  Amy Stilwell, [place]ber[place] II

## 2018-02-22 NOTE — Telephone Encounter (Signed)
Reviewed by MD and sent to scanning.Marland Kitchen

## 2018-04-11 ENCOUNTER — Encounter: Payer: Self-pay | Admitting: FAMILY PRACTICE

## 2018-04-11 ENCOUNTER — Ambulatory Visit: Payer: Commercial Managed Care - PPO | Admitting: FAMILY PRACTICE

## 2018-04-11 VITALS — BP 142/88 | HR 108 | Temp 98.3°F | Resp 12 | Wt 190.5 lb

## 2018-04-11 DIAGNOSIS — Z23 Encounter for immunization: Secondary | ICD-10-CM

## 2018-04-11 DIAGNOSIS — J189 Pneumonia, unspecified organism: Secondary | ICD-10-CM

## 2018-04-11 DIAGNOSIS — Z8701 Personal history of pneumonia (recurrent): Principal | ICD-10-CM

## 2018-04-11 NOTE — Nursing Note (Signed)
Immunization VIS documentation(s) were given to [facility] to review. All questions were answered and the patient consented to the Immunization(s) being given. Patient allergies were reviewed and no contraindications were found. Medication was verified by Dr. Jeris Penta prior to administration. The immunization(s) were given as ordered. The patient was observed for any immediate reactions to the vaccine. None were observed.  Dustin Ibarra [facility]

## 2018-04-11 NOTE — Nursing Note (Signed)
Vitals Taken. Britton Perkinson, MAII

## 2018-04-11 NOTE — Progress Notes (Signed)
Subjective: Dustin Ibarra is a 57yr old male  who presents for follow-up in regards to pneumonia.  In June he developed febrile illness and was diagnosed with atypical pneumonia and treated with azithromycin with resolution of his symptoms.  He is currently back to baseline.  He is due for shingles vaccine    I reviewed patient's past medical, family/social history  and no changes were made.     Current Medications (reviewed with patient):  Acetaminophen (TYLENOL) 500 mg Tablet, Take 2 tablets by mouth one time. Administered by Valetta Close at 1:35 PM 01/25/2018  Fluticasone (FLONASE) 50 mcg/actuation nasal spray, Instill 1 spray into EACH nostril every day.    No current facility-administered medications for this visit.       ROS: As documented in the subjective portion of my note.    OBJECTIVE:   BP 142/88   Pulse 108   Temp 36.8 C (98.3 F) (Temporal)   Resp 12   Wt 86.4 kg (190 lb 7.6 oz)   BMI 28.54 kg/m   Lungs clear  Heart regular rate and rhythm  Extremities are warm    Labs reviewed    ASSESSMENT & PLAN:   (Z87.01) History of pneumonia  (primary encounter diagnosis)  Comment: Resolved  Plan: Consider pneumonia vaccine    (Z23) Need for shingles vaccine  Plan: Initiate shingles vaccine      Barriers to Learning assessed: none. Patient verbalizes understanding of teaching and instructions.    Cinda Quest MD    Note: this chart was created in part with Paramedic.  Every effort has been made to correct any errors in the voice-recognition / dictation, but some may have been missed.  If there are any questions, please contact the author for clarification and revision if needed.

## 2018-05-02 ENCOUNTER — Ambulatory Visit: Payer: Commercial Managed Care - PPO | Attending: FAMILY PRACTICE | Admitting: FAMILY PRACTICE

## 2018-05-02 ENCOUNTER — Ambulatory Visit (INDEPENDENT_AMBULATORY_CARE_PROVIDER_SITE_OTHER): Payer: Commercial Managed Care - PPO

## 2018-05-02 ENCOUNTER — Encounter: Payer: Self-pay | Admitting: FAMILY PRACTICE

## 2018-05-02 VITALS — BP 140/90 | HR 94 | Resp 12 | Ht 68.0 in | Wt 192.0 lb

## 2018-05-02 DIAGNOSIS — Z1211 Encounter for screening for malignant neoplasm of colon: Secondary | ICD-10-CM | POA: Insufficient documentation

## 2018-05-02 DIAGNOSIS — G4730 Sleep apnea, unspecified: Secondary | ICD-10-CM | POA: Insufficient documentation

## 2018-05-02 DIAGNOSIS — Z1159 Encounter for screening for other viral diseases: Secondary | ICD-10-CM | POA: Insufficient documentation

## 2018-05-02 DIAGNOSIS — E559 Vitamin D deficiency, unspecified: Secondary | ICD-10-CM | POA: Insufficient documentation

## 2018-05-02 DIAGNOSIS — R7989 Other specified abnormal findings of blood chemistry: Principal | ICD-10-CM

## 2018-05-02 DIAGNOSIS — H6122 Impacted cerumen, left ear: Secondary | ICD-10-CM | POA: Insufficient documentation

## 2018-05-02 DIAGNOSIS — Z23 Encounter for immunization: Secondary | ICD-10-CM | POA: Insufficient documentation

## 2018-05-02 DIAGNOSIS — E7800 Pure hypercholesterolemia, unspecified: Secondary | ICD-10-CM | POA: Insufficient documentation

## 2018-05-02 DIAGNOSIS — E663 Overweight: Secondary | ICD-10-CM | POA: Insufficient documentation

## 2018-05-02 DIAGNOSIS — Z0000 Encounter for general adult medical examination without abnormal findings: Secondary | ICD-10-CM | POA: Insufficient documentation

## 2018-05-02 LAB — CBC WITH DIFFERENTIAL
Basophils % Auto: 0.8 %
Basophils Abs Auto: 0 10*3/uL (ref 0.0–0.2)
Eosinophils % Auto: 2.9 %
Eosinophils Abs Auto: 0.2 10*3/uL (ref 0.0–0.5)
Hematocrit: 45 % (ref 41.0–53.0)
Hemoglobin: 15.5 g/dL (ref 13.5–17.5)
Lymphocytes % Auto: 33.4 %
Lymphocytes Abs Auto: 1.8 10*3/uL (ref 1.0–4.8)
MCH: 31.2 pg (ref 27.0–33.0)
MCHC: 34.4 % (ref 32.0–36.0)
MCV: 90.8 fL (ref 80.0–100.0)
MPV: 7.5 fL (ref 6.8–10.0)
Monocytes % Auto: 10.9 %
Monocytes Abs Auto: 0.6 10*3/uL (ref 0.1–0.8)
Neutrophils % Auto: 52 %
Neutrophils Abs Auto: 2.7 10*3/uL (ref 1.8–7.7)
Platelet Count: 222 10*3/uL (ref 130–400)
RDW: 14.6 % (ref 0.0–14.7)
Red Blood Cell Count: 4.96 10*6/uL (ref 4.50–5.90)
White Blood Cell Count: 5.3 10*3/uL (ref 4.5–11.0)

## 2018-05-02 LAB — HEPATITIS C AB SCREEN: HEPATITIS C AB SCREEN: NONREACTIVE

## 2018-05-02 NOTE — Nursing Note (Signed)
The Influenza Vaccine VIS document for the flu injection was given to patient to review. Patient or person named in permission has answered no to any history of egg allergy, previous serious reaction to a influenza vaccine or current illness which would preclude them receiving an immunization. Any questions were referred to the physician. The Influenza Vaccine was then administered per protocol. The patient was observed for immediate reactions to the vaccine per protocol. None were observed.   Karen Kitchens      Ear lavage done per MD orders. Pt tolerated well. Seth Bake [facility]

## 2018-05-02 NOTE — Nursing Note (Signed)
Vitals Taken. Laramie Meissner, MAII

## 2018-05-02 NOTE — Progress Notes (Signed)
Patient presents with:  Physical      Subjective:   Dustin Ibarra is a 42yr male who presents for annual physical.  He feels good without complaints except for left ear fullness and intermittent elevated blood pressure.    Past Medical History:  10/18/2006: Insomnia, unspecified  10/18/2006: Other diseases of nasal cavity and sinuses(478.19)  10/18/2006: Pain in limb      Comment:  right foot  Varicella without mention of complication    Past Surgical History:  1968: PR APPENDECTOMY        Current Outpatient Medications:  Acetaminophen (TYLENOL) 500 mg Tablet, Take 2 tablets by mouth one time. Administered by Valetta Close at 1:35 PM 01/25/2018  Cholecalciferol (VITAMIN D) 1,000 unit (25 mcg) Tablet, Take 1,000 Units by mouth every day.  Fluticasone (FLONASE) 50 mcg/actuation nasal spray, Instill 1 spray into EACH nostril every day.    No current facility-administered medications for this visit.       Allergies    Pcn (Penicillin) [Penicillins]    Hives    Comment:Given shot for reaction.    Social History    Socioeconomic History      Marital status: SINGLE      Spouse name: Carymdee      Number of children: 1      Years of education: BA      Highest education level: Not on file    Occupational History      Occupation: Physiological scientist strain: Not hard at all      Food insecurity:        Worry: Never true        Inability: Never true      Transportation needs:        Medical: Not on file        Non-medical: No    Tobacco Use      Smoking status: Never Smoker      Smokeless tobacco: Never Used    Substance and Sexual Activity      Alcohol use: Yes        Frequency: 4 or more times a week        Drinks per session: 1 or 2        Comment: 6 per week      Drug use: No      Sexual activity: Yes        Partners: Female        Birth control/protection: Surgical    Lifestyle      Physical activity:        Days per week: 4 days        Minutes per session: 60 min      Stress: Not at all     Relationships      Social connections:        Talks on phone: Not on file        Gets together: Not on file        Attends religious service: Not on file        Active member of club or organization: Not on file        Attends meetings of clubs or organizations: Not on file        Relationship status: Not on file      Intimate partner violence:        Fear of current or ex partner: No  Emotionally abused: Not on file        Physically abused: No        Forced sexual activity: No    Other Topics      Concerns:        Military Service: No        Blood Transfusions: No        Caffeine Concern: No        Occupational Exposure: No        Hobby Hazards: No        Sleep Concern: No        Stress Concern: No        Weight Concern: No        Special Diet: No        Back Care: No        Exercise: Yes        Bike Helmet: Yes        Seat Belt: Yes        Self-Exams: Yes      Review of patient's family history indicates:  Problem: Heart      Relation: Mother          Name:               Age of Onset: (Not Specified)              Comment: CHF  Problem: Cancer      Relation: Mother          Name:               Age of Onset: (Not Specified)              Comment: breast  Problem: Hypertension      Relation: Father          Name:               Age of Onset: (Not Specified)  Problem: Stroke      Relation: Father          Name:               Age of Onset: (Not Specified)              Comment: hemorrhagic        Immunization History  Administered            Date(s) Administered    Influenza Vaccine (Fluarix)                          06/13/2008      Influenza Vaccine (Fluzone/fluarix Prefill Syringe)                          05/30/2010  05/21/2011      Tdap (Adacel )        10/18/2006  11/09/2016      Zoster Vaccine (recombinant)                          04/11/2018    Pended                  Date(s) Pended    Influenza Vaccine, Quadrivalent (Fluarix/Flulaval/Fluzone Prefill Syringe)                          05/02/2018    Deferred  Date(s) Deferred    Zoster Vaccine (recombinant)                          11/09/2016      ROS:   Constitutional: negative.  Eyes: negative.  Ears, Nose, Mouth, Throat: Left ear fullness.  CV: negative.  Resp: negative.  GI: negative.  GU: negative.  Musculoskeletal: negative.  Integumentary: negative.  Neuro: negative.  Psych: negative.  Endo: negative.  Heme/Lymphatic: negative.  Allergy/Immun: negative.       OBJECTIVE:   BP 140/90   Pulse 94   Resp 12   Ht 1.727 m (5\' 8" )   Wt 87.1 kg (192 lb 0.3 oz)   BMI 29.20 kg/m   General Appearance: healthy, alert, no distress, pleasant affect, cooperative.  Eyes:  conjunctivae and corneas clear. PERRL, EOM's intact.   Ears: Right external auditory canal has small amount of cerumen removed with curette.  Tympanic membrane is normal.  Left external auditory canal is occluded by cerumen.  After irrigation canal is free of cerumen and membrane is normal.  Nose:  normal.  Mouth: normal.  Neck:  Neck supple. No adenopathy, thyroid symmetric, normal size.  Heart:  normal rate and regular rhythm, no murmurs.  Lungs: clear to auscultation, no chest deformities noted.  Abdomen: BS normal.  Abdomen soft, non-tender.  No masses or organomegaly.  GU: Normal male genitalia.  Testicles descended bilaterally..  Extremities:  no cyanosis, clubbing, or edema.  Skin:  negative.  Neuro: Gait normal. Reflexes normal and symmetric. Sensation and strength grossly normal.  Mental Status: Appearance/Cooperation: in no apparent distress, well developed and well nourished and non-toxic    Labs reviewed    ASSESSMENT & PLAN:   (Z00.00) Annual physical exam  (primary encounter diagnosis)  Plan: Exercise counseling.  Nutritional counseling.  Education regarding seatbelt use and bike helmet use.  Education regarding smoke alarms and carbon monoxide detectors.  Discussed occasional drug use and alcohol use.  Discussed general safety measures.  Provide influenza vaccine.  Other vaccines  are up-to-date.  Refer to GI for colonoscopy.  Patient had refused in the past.  Provided information on prostate cancer.    (R79.89) Abnormal CBC  Comment: Decreased hematocrit  Plan: CBC WITH DIFFERENTIAL        Additional bowel depend on results of follow-up CBC    (E66.3) Overweight (BMI 25.0-29.9)  Comment: Body mass index is 29.2 kg/m.   Plan: Nutritional counseling.  Exercise counseling    (E55.9) Vitamin D deficiency  Plan: Continue vitamin D    (Z11.59) Need for hepatitis C screening test  Plan: HEPATITIS C AB SCREEN            (Z23) Flu vaccine need  Plan: INFLUENZA VACCINE (72 MONTH OLD AND OLDER),         QUADRIVALENT, NO LATEX, NO PRESERVATIVE            (H61.22) Impacted cerumen of left ear  Comment: Successfully resolved with irrigation.  Plan: Reevaluate if symptoms recur    (E78.00) High cholesterol  Plan: Continue lifestyle changes    (G47.30) Mild sleep apnea  Comment: Treated with positional  Plan: Weight loss.  Minimize alcohol.  Consider repeat sleep study if he develops symptoms    (Z12.11) Screen for colon cancer  Plan: GASTROENTEROLOGY REFERRAL              Barriers to Learning assessed: none. Patient verbalizes understanding of teaching and instructions.  Cinda Quest MD    Note: this chart was created in part with Paramedic. Every effort has been made to correct any errors in the voice-recognition / dictation, but some may have been missed. If there are any questions, please contact the author for clarification and revision if needed.

## 2018-05-02 NOTE — Patient Instructions (Addendum)
Please obtain flu vaccine prior to discharge.     New lab tests have been ordered. Please do these tests today      An electronic referral has been submitted to GASTROENTEROLOGY for further evaluation and treatment of your problem. You should receive an approval notice in the mail within 2 weeks. After the approval notice has been received, or if a notice is not received within two weeks, you may call [phone] or [phone] to schedule your appointment.        - - - - - - - - - - - - - - - Information on Prostate Cancer - - - - - - - - - - - - - - -    The prostate is a gland, about the size of a walnut, located under the bladder and surrounding the upper part of the urethra. The urethra is a tube that carries urine and semen through the penis to the outside of the body. The prostate gland is found only in men. It produces semen, the liquid that carries the sperm when a man ejaculates.    Prostate cancer starts in the cells of the prostate gland. When a man has prostate cancer, the prostate cancer cells grow into a tumor. These cancer cells can also spread into other parts of the body.    The most common symptom of prostate cancer is no symptom at all.     Risk Factors and Prevention:    While 1 man out of 6 will be diagnosed with prostate cancer during his lifetime, only 94 man in 48 will die of this disease. The death rate for prostate cancer is going down. And the disease is being found earlier as well.     Prostate cancer is a disease that is often associated with aging. About 93 percent of men are aged 43 years and older when diagnosed with prostate cancer.     African American men are at especially high risk for prostate cancer: They are over 85 percent more likely to develop this disease than non-Hispanic white men. African American men are twice as likely to die of this disease. Prostate cancer occurs less often in Asian men than in whites.    Men with close family members (father or brother) who  have had prostate cancer are more likely to get it themselves, especially if their relatives were young when they got the disease.     In [place], approximately [phone] men will be diagnosed with prostate cancer in 2007, 3,010 men will die from it and [phone] men will continue living with it.     Because we don't know the exact cause of prostate cancer, it is not possible to give good advice about prevention. Some experts think that diet can reduce the risk of developing prostate cancer by eating less red meat and fat, and eating more fruits, vegetables and grains.    Prostate Cancer Screening Options:    Prostate cancer can often be found early by testing the amount of Prostate-Specific Antigen (PSA) in a man's blood. PSA is a substance made by the normal prostate gland. Although PSA is mostly found in semen, a small amount is also found in the blood. There is no question that the PSA test can help spot prostate cancer. But it can't tell how dangerous the cancer is.    Another way to find prostate cancer is the Digital Rectal Exam (DRE). To do the DRE, the doctor inserts a gloved, lubricated  finger into the rectum to feel for any irregular or firm areas that might be cancer. The DRE is less effective than the PSA blood test in finding prostate cancer, but it can sometimes find cancers in men with normal PSA levels. For this reason, the [place] (ACS) recommends that when prostate cancer screening is done, both the DRE and the PSA should be used.     These tests are not always accurate, however. Wrong test results could lead to excess worry, or even an unneeded biopsy or other tests. Until more is known, one should talk to his doctor about whether or not he needs to be tested. Things to take into account are age and health. If one is young and gets prostate cancer, it will probably shorten his life if it is not caught early. But if one is older or in poor health, then prostate cancer may never  become a major problem because it often grows so slowly.     ACS believes that doctors should offer the PSA blood test and DRE yearly, beginning at age 43 to men who do not have any major medical problems and can be expected to live at least 10 more years. Men at high risk should begin testing at age 37. Men at high risk include African Americans and men who have a close relative who had prostate cancer before age 25. Men at even higher risk (have several close relatives with prostate cancer at an early age) could begin testing at age 15.     If certain symptoms or the results of early tests suggest one might have prostate cancer, the doctor will use further tests to find out whether the disease is present.    The prostate biopsy is the only way to know for sure if a man has prostate cancer. During a biopsy, tissue from a man's prostate is removed so it can be sent to the lab to see if there are cancer cells. A core needle biopsy is the main method used.    Cancer Treatment:    Most of the time, prostate cancer grows slowly. Autopsy studies show that many older men who died of other diseases also had prostate cancer that neither they nor their doctor were aware of. But sometimes prostate cancer can grow and spread quickly. Even with the latest methods, it is hard to tell which prostate cancers will grow slowly and which will grow quickly.     If cancer is present, the biopsy sample will be graded. Grading the cancer helps to predict how fast the cancer is likely to grow and spread. A staging system is a way to describe the extent to which the cancer has spread.    Surgery, radiation, and hormone therapy are the most common treatments for prostate cancer. Chemotherapy may be used in some cases, and watchful waiting, though not an active form of treatment, may also be used.    There are risks and significant side effects with all forms of treatment for prostate cancer. They include impotence, sterility, and  incontinence.    Several factors should be taken into account before one chooses a course of treatment. These factors include age, overall health, goals for treatment, and one's feelings about side effects, risks and complications of treatment.. Some men, for example, can't imagine living with side effects such as incontinence or impotence. Others are less concerned about these and more concerned about getting rid of the cancer.  It is often helpful  to discuss treatment options with more than one doctor. Many men find that talking to others who have faced the same issues is helpful.    Prostate Cancer Survival Rates:    Overall, 99 percent of men diagnosed with prostate cancer survive at least 5 years. Ninety one percent of all prostate cancers are found while they are still within the prostate or only in nearby areas. Nearly 100 percent of these men survive at least 5 years. For the men whose cancer has already spread to distant parts of the body when it is found, 34 percent will survive at least 5 years.    Sources:  1. American Cancer Society, Overview: Prostate Cancer. Available @ www.cancer.org. Accessed:  January 2007  2. IMPACT, What Every Man Should Know About Prostate Cancer? Brochure.  Available @ http://www.[place]-impact.org/public/genlbro.pdf . Accessed: January 2007.  3. American Cancer Society, [place] Division, and Public health Institute, [place] Cancer  Registry, [place] Cancer Facts & Figures 2007, [place], [place]. September 2006

## 2018-08-01 ENCOUNTER — Encounter: Payer: Self-pay | Admitting: FAMILY PRACTICE

## 2018-08-01 ENCOUNTER — Ambulatory Visit: Payer: Commercial Managed Care - PPO | Admitting: FAMILY PRACTICE

## 2018-08-01 VITALS — BP 147/92 | HR 101 | Temp 97.1°F | Wt 187.6 lb

## 2018-08-01 DIAGNOSIS — I10 Essential (primary) hypertension: Principal | ICD-10-CM

## 2018-08-01 MED ORDER — LOSARTAN 25 MG TABLET
25.0000 mg | ORAL_TABLET | Freq: Every day | ORAL | 3 refills | Status: DC
Start: 2018-08-01 — End: 2019-02-04

## 2018-08-01 NOTE — Nursing Note (Signed)
Vital signs taken,tobacco,allergies,pharmacy verified, screened for pain.  Kaitlan Bin, MA

## 2018-08-01 NOTE — Patient Instructions (Signed)
New lab tests have been ordered. Please do these tests in 2 weeks.  Fasting is NOT necessary.  The lab tests have been ordered through the computer an no paper lab slip is needed and no appointment is needed. The lab is located on the first floor of the clinic building in the waiting area for [address].  Hours: 7:30 - 12:00 AM/12:30 - 4:30 PM Walk in.  Closed 12:00 - 12:30 PM

## 2018-08-01 NOTE — Progress Notes (Signed)
Subjective: Dustin Ibarra is a 57yr old male  who presents for follow-up in regards to hypertension.  Blood pressure has been consistently greater than 140/90.  He is compliant with exercise low-sodium diet and is minimizing alcohol.  He has not had chest pain but does have some shortness of breath at elevation.  He has a family history of hypertension.    I reviewed patient's past medical, family/social history  and no changes were made.     Current Medications (reviewed with patient):  Acetaminophen (TYLENOL) 500 mg Tablet, Take 2 tablets by mouth one time. Administered by Valetta Close at 1:35 PM 01/25/2018  Cholecalciferol (VITAMIN D) 1,000 unit (25 mcg) Tablet, Take 1,000 Units by mouth every day.  Fluticasone (FLONASE) 50 mcg/actuation nasal spray, Instill 1 spray into EACH nostril every day.  Losartan (COZAAR) 25 mg Tablet, Take 1 tablet by mouth every day.    No current facility-administered medications for this visit.       ROS: As documented in the subjective portion of my note.    OBJECTIVE:   BP (!) 147/92 (SITE: left arm, Orthostatic Position: sitting, Cuff Size: large)   Pulse 101   Temp 36.2 C (97.1 F) (Tympanic)   Wt 85.1 kg (187 lb 9.8 oz)   BMI 28.53 kg/m   Lungs clear.  Heart regular rate and rhythm.  Extremities are warm        ASSESSMENT & PLAN:   (I10) Essential hypertension  (primary encounter diagnosis)  Comment: New diagnosis  Plan: Losartan (COZAAR) 25 mg Tablet, BASIC METABOLIC        PANEL        Low-sodium diet.  Exercise.  Maintain healthy weight.  Minimize alcohol.  Follow-up 3 months time.      Barriers to Learning assessed: none. Patient verbalizes understanding of teaching and instructions.    Cinda Quest MD    Note: this chart was created in part with Paramedic.  Every effort has been made to correct any errors in the voice-recognition / dictation, but some may have been missed.  If there are any questions, please contact the author for clarification and  revision if needed.

## 2018-08-31 ENCOUNTER — Ambulatory Visit: Payer: Commercial Managed Care - PPO | Attending: FAMILY PRACTICE

## 2018-08-31 DIAGNOSIS — I10 Essential (primary) hypertension: Principal | ICD-10-CM | POA: Insufficient documentation

## 2018-08-31 LAB — BASIC METABOLIC PANEL
CALCIUM: 9.3 mg/dL (ref 8.6–10.5)
CARBON DIOXIDE TOTAL: 25 mmol/L (ref 24–32)
CHLORIDE: 101 mmol/L (ref 95–110)
CREATININE BLOOD: 0.91 mg/dL (ref 0.44–1.27)
E-GFR, NON-AFRICAN AMERICAN: 94 mL/min/{1.73_m2}
GLUCOSE: 105 mg/dL — AB (ref 70–99)
POTASSIUM: 4.1 mmol/L (ref 3.3–5.0)
SODIUM: 135 mmol/L (ref 135–145)
UREA NITROGEN, BLOOD (BUN): 9 mg/dL (ref 8–22)

## 2018-09-29 ENCOUNTER — Telehealth: Payer: Self-pay | Admitting: FAMILY PRACTICE

## 2018-09-29 NOTE — Telephone Encounter (Signed)
3 patient identifiers used.  Per:   patient.      ClearTriage Note: Hx of PNA  The patient reports having URI s/s productive cough, congested in nose and chest and fever. The patient is going on a cruise next week and wants to get better before the cruise. The clinic is full and the patient was referred to [place].     Protocol Used: Cough - Acute Productive (Adult)  Protocol-Based Disposition: See PCP within 24 Hours    Positive Triage Questions:  * [1] Continuous (nonstop) coughing interferes with work or school AND [2] no improvement using cough treatment per Care Advice  * Earache  * Cough  * Cough with cold symptoms (e.g., runny nose, postnasal drip, throat clearing)    Care Advice Discussed:  * Contagiousness      - The cold virus is present in your nasal secretions.      - Cover your nose and mouth with a tissue when you sneeze or cough. Wash your hands frequently.      - You can return to work or school after the fever is gone and you feel well enough to participate in normal activities.  * Reasons To Call Back      - Cough lasts over 3 weeks      - Continuous coughing persists over 2 hours after cough treatment      - Difficulty breathing occurs      - Fever over 103 F (39.4 C)      - Fever lasts over 3 days      - You become worse.      - Nasal discharge lasts over 10 days      - Earache or facial pain develops    Negative Triage Questions:  * Severe difficulty breathing (e.g., struggling for each breath, speaks in single words)  * Bluish (or gray) lips or face now  * [1] Difficulty breathing AND [2] exposure to flames, smoke, or fumes  * [1] Stridor AND [2] difficulty breathing  * Sounds like a life-threatening emergency to the triager  * Chest pain  (Exception: MILD central chest pain, present only when coughing)  * Difficulty breathing  * Patient sounds very sick or weak to the triager  * [1] Coughed up blood AND [2] > 1 tablespoon (15 ml) (Exception: blood-tinged sputum)  * Fever > 103 F (39.4 C)  * [1]  Fever > 101 F (38.3 C) AND [2] age > 63  * [1] Fever > 100.0 F (37.8 C) AND [2] bedridden (e.g., nursing home patient, CVA, chronic illness, recovering from surgery)  * [1] Fever > 100.0 F (37.8 C) AND [2] diabetes mellitus or weak immune system (e.g., HIV positive, cancer chemo, splenectomy, organ transplant, chronic steroids)  * Wheezing is present  * SEVERE coughing spells (e.g., whooping sound after coughing, vomiting after coughing)  * Coughing up rusty-colored (reddish-brown) sputum  * Fever present > 3 days (72 hours)  * [1] Fever returns after gone for over 24 hours AND [2] symptoms worse or not improved  * [1] Using nasal washes and pain medicine > 24 hours AND [2] sinus pain (around cheekbone or eye) persists  * ALSO, mild central chest pain occurs only when coughing  * ALSO, mild vomiting occurs only when coughing  Disposition: Referred to [place] per Cough-Acute-Productive protocol  Per:   patient verbalizes agreement to plan. Agrees to callback with any increase in symptoms/concerns or questions.  [place]  PCN Triage Nurse

## 2018-10-31 ENCOUNTER — Ambulatory Visit: Payer: Self-pay | Admitting: FAMILY PRACTICE

## 2019-01-02 ENCOUNTER — Encounter: Payer: Self-pay | Admitting: FAMILY PRACTICE

## 2019-01-02 NOTE — Telephone Encounter (Signed)
From: [name]  To: [name], MD  Sent: 01/02/2019 9:44 AM PDT  Subject: Non-urgent Medical Advice Question    Hello Dr Jeris Penta,  I have been experiencing recurring [place] while on my layover in [place] at the [employer].  Naturally, I'm experiencing more stress on the job during the pandemic, but am wondering if my Losartan Rx might be triggering a reaction like this.  Photos of the hives are attached.  Hope you and your family are well.  Sincerely,  [name]

## 2019-01-25 ENCOUNTER — Encounter: Payer: Self-pay | Admitting: FAMILY PRACTICE

## 2019-01-25 NOTE — Telephone Encounter (Signed)
From: [name]  To: [name], MD  Sent: 01/25/2019 9:57 AM PDT  Subject: Non-urgent Medical Advice Question    Hello Dr Jeris Penta, thank you for your response and I'm feeling better now. The Zyrtec worked, but makes it difficult to urinate, so I'm not taking as frequently now- only one recurrence of the hives last week.  Are you accepting new patients? My wife, Dustin Ibarra, age 58, is concerned since she bruises very easily now and her urine is a mustard yellow color. She is otherwise healthy, but would like to be seen.  Thank you, [name], for your help and kind concern.  Dustin Ibarra

## 2019-02-04 ENCOUNTER — Encounter: Payer: Self-pay | Admitting: FAMILY PRACTICE

## 2019-02-04 DIAGNOSIS — I10 Essential (primary) hypertension: Secondary | ICD-10-CM | POA: Insufficient documentation

## 2019-02-04 MED ORDER — AMLODIPINE 2.5 MG TABLET
2.5000 mg | ORAL_TABLET | Freq: Every day | ORAL | 11 refills | Status: DC
Start: 2019-02-04 — End: 2019-10-16

## 2019-02-04 NOTE — Telephone Encounter (Signed)
From: [name]  To: [name], MD  Sent: 02/04/2019 10:40 AM PDT  Subject: Visit Follow-up Question    Hello Dr Jeris Penta, I quit taking the Losartan, but still have hives but not as frequently thanks to the Zyrtec. I am having some difficulty urinating now.   Should I resume the Losartan? My blood pressure still runs high and I'm concerned about that.  Thank you, Dustin Ibarra.  Elta Guadeloupe

## 2019-06-14 ENCOUNTER — Other Ambulatory Visit (HOSPITAL_COMMUNITY): Payer: Self-pay

## 2019-06-14 NOTE — Telephone Encounter (Signed)
Opened in Error.

## 2019-06-15 ENCOUNTER — Other Ambulatory Visit (HOSPITAL_COMMUNITY): Payer: Self-pay

## 2019-06-15 NOTE — Telephone Encounter (Signed)
Opened in error

## 2019-06-19 ENCOUNTER — Ambulatory Visit: Payer: Commercial Managed Care - PPO

## 2019-06-19 DIAGNOSIS — Z23 Encounter for immunization: Secondary | ICD-10-CM

## 2019-06-19 NOTE — Nursing Note (Signed)
The patient has received a flu vaccine previously: yes  The Influenza Vaccine VIS document for the flu injection was given to patient to review. Patient or person named in permission has answered no to any history of egg allergy, previous serious reaction to a influenza vaccine or current illness which would preclude them receiving an immunization via protocol and policy 2041 without additional physician input. Any questions were referred to the physician. The Influenza Vaccine was then administered per protocol or by Policy 2041 using the standing order from Dr. James Kirk (Academic) or Dr. Kurt Slapnik (Network). The patient was observed for 15  minutes for immediate reactions to the vaccine per protocol. no reaction was observed.   Patient WAS wearing a surgical mask  Droplet precautions were followed when caring for the patient.   PPE used by provider during encounter: Surgical mask and Face Shield/Goggles    Injection given by    Tonishia Steffy, LVN

## 2019-10-15 ENCOUNTER — Encounter: Payer: Self-pay | Admitting: FAMILY PRACTICE

## 2019-10-15 NOTE — Telephone Encounter (Signed)
From: Dustin Ibarra  To: Cinda Quest, MD  Sent: 10/14/2019 12:46 PM PST  Subject: Non-urgent Medical Advice Question    Hello Dr Jeris Penta,  I had a company periodic physical on Feb 22 at [facility] and my BP was taken 4 times since it was high-- around 156 over 86-- and it never went down even towards the end of the physical.  I'm still taking 2.5 mg Amlodipine Besylate as you prescribed. The examining Dr took my pulse and said he picked up an irregular heartbeat, too, and advised me to make an appt w/ Dr..  I got a call from [place] on Thursday, and have not been able to return the call since I cannot use my phone during work. 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 job, but hopefully a visit to you, and follow-up would keep me working.  Hope you and your family are well, [name]. Looking forward to hearing from you.  Sincerely,  Dustin Ibarra

## 2019-10-16 ENCOUNTER — Encounter: Payer: Self-pay | Admitting: Family Medicine

## 2019-10-16 ENCOUNTER — Ambulatory Visit: Payer: Commercial Managed Care - PPO | Admitting: Family Medicine

## 2019-10-16 VITALS — BP 132/80 | HR 106 | Temp 97.9°F | Resp 16 | Ht 68.0 in | Wt 199.0 lb

## 2019-10-16 DIAGNOSIS — Z13220 Encounter for screening for lipoid disorders: Secondary | ICD-10-CM

## 2019-10-16 DIAGNOSIS — I10 Essential (primary) hypertension: Secondary | ICD-10-CM

## 2019-10-16 DIAGNOSIS — E669 Obesity, unspecified: Secondary | ICD-10-CM

## 2019-10-16 DIAGNOSIS — Z125 Encounter for screening for malignant neoplasm of prostate: Secondary | ICD-10-CM

## 2019-10-16 MED ORDER — AMLODIPINE 2.5 MG TABLET
5.0000 mg | ORAL_TABLET | Freq: Every day | ORAL | 3 refills | Status: DC
Start: 2019-10-16 — End: 2019-10-23

## 2019-10-16 NOTE — Progress Notes (Signed)
14:33    Chief Complaint   Patient presents with    Hypertension       Dustin Ibarra is a 24yr male who presents with a chief complaint of follow up on Blood pressure .   elevated Bp at work 156/86 to 142/ 80   He needs to be lowered by end of the month .  He is taking Norvasc 2.5 mg   He has not been exercising as much and eating well .    He just started walking ( 2 miles yesterday )   He is running a train , sedentary lifestyle , stressful     Lab Results   Lab Name Value Date/Time    CHOL 185 02/21/2018 11:07 AM    CHOL 224 (H) 10/09/2013 08:03 AM    LDLC 122 02/21/2018 11:07 AM    LDLC 145 (H) 10/09/2013 08:03 AM    HDL 40 02/21/2018 11:07 AM    HDL 61 10/09/2013 08:03 AM    TRIG 113 02/21/2018 11:07 AM    TRIG 90 10/09/2013 08:03 AM       Lab Results   Lab Name Value Date/Time    NA 135 08/31/2018 10:22 AM    NA 137 10/09/2013 08:03 AM    K 4.1 08/31/2018 10:22 AM    K 3.9 10/09/2013 08:03 AM    CL 101 08/31/2018 10:22 AM    CL 104 10/09/2013 08:03 AM    CO2 25 08/31/2018 10:22 AM    CO2 25 10/09/2013 08:03 AM    BUN 9 08/31/2018 10:22 AM    BUN 11 10/09/2013 08:03 AM    CR 0.91 08/31/2018 10:22 AM    CR 0.82 10/09/2013 08:03 AM    GLU 105 (H) 08/31/2018 10:22 AM    GLU 85 10/09/2013 08:03 AM       Lifestyle Vital Signs:    Sleep ( < 7 hours , > 9 )     7 hours      Tobacco      N      Alcohol  ( > 14 / week , or > 3 / day )      Y: daily , 3 IPAs      Emotional well- being , PHQ2 0/6       Diet: ( 5 fruits and Vegetables / day )     Fruits: 2    Veg: 2-3      Exercise 150 minutes / week      walking       BMI  < 25 , > 18    30      Stress Level     Moderate       Past Medical History:   Diagnosis Date    Insomnia, unspecified 10/18/2006    Other diseases of nasal cavity and sinuses(478.19) 10/18/2006    Pain in limb 10/18/2006    right foot    Varicella without mention of complication      Current Outpatient Medications on File Prior to Visit   Medication Sig Dispense Refill    Amlodipine (NORVASC) 2.5 mg  Tablet Take 1 tablet by mouth every day. 30 tablet 11    Cholecalciferol (VITAMIN D) 1,000 unit (25 mcg) Tablet Take 1,000 Units by mouth every day.      Fluticasone (FLONASE) 50 mcg/actuation nasal spray Instill 1 spray into EACH nostril every day. 48 g 3     No current facility-administered medications on file  prior to visit.         Pcn (Penicillin) [Penicillins]    Hives    Comment:Given shot for reaction.      Review of Systems -   ENT:   SAR : flonase           Eye:  negative  Neck: no pain, no swollen glands, normal ROM  Lungs: no DOE or cough .  No chest pain or palpitations   GI: negative  GU: nocturia  Musculoskelatal: negative  Neuro: negative  SKin: negative  Psych: work stress    Blood pressure 132/80, pulse 106, temperature 36.6 C (97.9 F), temperature source Tympanic, resp. rate 16, height 1.727 m (5\' 8" ), weight 90.3 kg (199 lb).        General Appearance: healthy, alert, no distress, pleasant affect, cooperative.  Heart:  normal rate and regular rhythm, no murmurs, clicks, or gallops.  Lungs: clear to auscultation.  Extremities:  no cyanosis, clubbing, or edema.  Mental Status: Appearance/Cooperation: in no apparent distress, well developed and well nourished, non-toxic, in no respiratory distress and acyanotic, alert and well groomed and dressed  Attitude: pleasant   Behavior :normal  Eye Contact: normal  Attention Span: good  Speech: normal volume, rate, and pitch  Mood (pt's report) :Mood pt's report, euthymic      (I10) Essential hypertension  (primary encounter diagnosis)  Comment: not quite at goal   Recommend cutting out alcohol which should improve the weight and lower Bp    will increase to 5 mg of Norvasc daily for now to get Bp to goal 120/ 75 , while he attempts more TLC  Plan: Lipid Panel with DLDL Reflex, Comprehensive         Metabolic Panel, Amlodipine (NORVASC) 2.5 mg         Tablet        see patient instructions.      (Z13.220) Screening, lipid  Comment:   Plan: Lipid Panel  with DLDL Reflex            (Z12.5) Screening PSA (prostate specific antigen)  Comment:   Plan: PSA Screen      (E66.9) Obesity (BMI 30-39.9)  Comment:  discussed calorie density and instructed to not drink your calories and  avoid foods that are Calorie Rich And Processed.    Plan: try to eat more whole plant foods, fiber , less processed                 I did review patient's past medical and family/social history, no changes noted.    Barriers to Learning assessed: none. Patient verbalizes understanding of teaching and instructions.     Electronically signed by   Michael Boston MD    Associate Physician

## 2019-10-16 NOTE — Patient Instructions (Addendum)
Hypertension    to improve Bp , reduce:   1. Sodium:     Largest sources of sodium in [place] are      - Processed foods account for 75 % of American dietary intake      - Pizza, breads, rolls, crackers, luncheon meats, cold cuts and processed meats      - Canned foods such as soups and vegetables    2. Saturated Fat ( cheese, high-fat dairy , animal fats )    3. Alcohol : men limit to < 3 drinks / day ; women < 2/ day   4. Caffeine, excess weight , tobacco , stress and physical inactivity       Improve Bp by increasing intake of:    1. Potassium rich foods: potatoes, sweet potatoes , fruits ( bananas, cantaloupe, peaches ) ; Vegetables ( squash , broccoli , spinach )   Legumes ( white beans, lentils, soy , lima )    2. Calcium rich foods: Greens ( choose low-oxalate greens like collards, kale, turnip greens, mustard greens and bok choy)   Beans ( white beans, black-eyed peas , etc ) and Fortified non dairy milks .   3. Magnesium rich foods: Nuts ( almonds, cashews, peanuts )    Beans ( black, soy , kidney ) , Avocado, Quinoa    4. Garlic    5 Water only fasting           Check out nutritionfacts.org ( Hypertension ) , Dr Alden Benjamin lecture on you tube or book: " How Not To Die "  Also use I-phone app: Dr Jerilee Hoh daily dozen      Consider reading The [place] STudy by T Heath Gold       The Pleasure Trap by Dr Katherina Mires and Layla Maw     For Mindfulness training, consider using a meditation App : Waking Up ( with Terrill Mohr ) or Calm     Try to limit alcohol intake during the week        Do fasting labs

## 2019-10-16 NOTE — Nursing Note (Signed)
Patient ID confirmed using 2 identifiers.     Vital signs taken, allergies verified, screened for pain, and med history taken.     Patient WAS wearing a surgical mask  Contact precautions were followed when caring for the patient.   PPE used by provider during encounter: Surgical mask        Lizette Pazos, MA

## 2019-10-23 ENCOUNTER — Ambulatory Visit: Payer: Commercial Managed Care - PPO | Admitting: FAMILY PRACTICE

## 2019-10-23 ENCOUNTER — Encounter: Payer: Self-pay | Admitting: FAMILY PRACTICE

## 2019-10-23 VITALS — BP 142/82 | HR 93 | Temp 98.2°F | Resp 16 | Wt 197.3 lb

## 2019-10-23 DIAGNOSIS — I10 Essential (primary) hypertension: Secondary | ICD-10-CM

## 2019-10-23 DIAGNOSIS — Z23 Encounter for immunization: Secondary | ICD-10-CM

## 2019-10-23 MED ORDER — AMLODIPINE 10 MG TABLET
10.0000 mg | ORAL_TABLET | Freq: Every day | ORAL | 3 refills | Status: DC
Start: 2019-10-23 — End: 2020-09-06

## 2019-10-23 NOTE — Progress Notes (Signed)
Patient WAS wearing a surgical mask  Droplet precautions were followed when caring for the patient.   PPE used by provider during encounter: Surgical mask      Subjective: Dustin Ibarra is a 59yr old male  who presents for advice regarding high blood pressure.  He has history of hypertension and takes amlodipine 5 mg a day.  Recently he was found to have elevated blood pressure.  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  He is not having chest pain shortness of breath or rapid heart rate.    I reviewed patient's past medical, family/social history  and no changes were made.     Current Medications (reviewed with patient):  Amlodipine (NORVASC) 10 mg Tablet, Take 1 tablet by mouth every day.  Cholecalciferol (VITAMIN D) 1,000 unit (25 mcg) Tablet, Take 1,000 Units by mouth every day.  Fluticasone (FLONASE) 50 mcg/actuation nasal spray, Instill 1 spray into EACH nostril every day.    No current facility-administered medications for this visit.      ROS: As documented in the subjective portion of my note.    OBJECTIVE:   BP 142/82   Pulse 93   Temp 36.8 C (98.2 F) (Temporal)   Resp 16   Wt 89.5 kg (197 lb 5 oz)   BMI 30.00 kg/m   Lungs clear.  Heart regular rate and rhythm.  Extremities warm    Note from Dr Antony Haste reviewed    ASSESSMENT & PLAN:   (I10) Essential hypertension  (primary encounter diagnosis)  Comment: Not controlled  Plan: Increase amlodipine to 10 mg a day.  Low-sodium diet.  Minimize alcohol.  Nurse visit for blood pressure check next week.  Follow-up 2 weeks.    (Z23) Need for shingles vaccine  Plan: ZOSTER VACCINE (RECOMBINANT)              Barriers to Learning assessed: none. Patient verbalizes understanding of teaching and instructions.    Cinda Quest MD    Note: this chart was created in part with Paramedic.  Every effort has been made to correct any errors in the voice-recognition / dictation, but some may have been missed.  If there are  any questions, please contact the author for clarification and revision if needed.

## 2019-10-23 NOTE — Addendum Note (Signed)
Addended by: Harlan Stains L on: 10/23/2019 03:37 PM     Modules accepted: Orders

## 2019-10-23 NOTE — Patient Instructions (Signed)
Please obtain Shingrix vaccine prior to discharge.

## 2019-10-23 NOTE — Nursing Note (Signed)
Immunization VIS documentation(s) were given to Dustin Ibarra to review. All questions were answered and the patient consented to the Immunization(s) being given. Patient allergies were reviewed and no contraindications were found. The immunization(s) were given as ordered. The patient was observed for any immediate reactions to the vaccine. None were observed. Smith Mince, LVN

## 2019-10-23 NOTE — Nursing Note (Signed)
Vitals Taken. Kaylenn Civil, MAII

## 2019-10-30 ENCOUNTER — Ambulatory Visit (INDEPENDENT_AMBULATORY_CARE_PROVIDER_SITE_OTHER): Payer: Commercial Managed Care - PPO

## 2019-10-30 ENCOUNTER — Ambulatory Visit: Payer: Commercial Managed Care - PPO | Attending: Family Medicine

## 2019-10-30 DIAGNOSIS — Z125 Encounter for screening for malignant neoplasm of prostate: Secondary | ICD-10-CM | POA: Insufficient documentation

## 2019-10-30 DIAGNOSIS — I10 Essential (primary) hypertension: Secondary | ICD-10-CM

## 2019-10-30 DIAGNOSIS — Z13220 Encounter for screening for lipoid disorders: Secondary | ICD-10-CM | POA: Insufficient documentation

## 2019-10-30 LAB — COMPREHENSIVE METABOLIC PANEL
Alanine Transferase (ALT): 25 U/L (ref 6–63)
Albumin: 3.8 g/dL (ref 3.4–4.8)
Alkaline Phosphatase (ALP): 80 U/L (ref 35–115)
Aspartate Transaminase (AST): 23 U/L (ref 15–43)
Bilirubin Total: 0.6 mg/dL (ref 0.3–1.3)
Calcium: 9.2 mg/dL (ref 8.6–10.5)
Carbon Dioxide Total: 26 mmol/L (ref 24–32)
Chloride: 103 mmol/L (ref 95–110)
Creatinine Serum: 0.85 mg/dL (ref 0.44–1.27)
E-GFR Creatinine (Male): 97 mL/min/{1.73_m2}
E-GFR, African American (Male): >100 mL/min/{1.73_m2}
Glucose: 97 mg/dL (ref 70–99)
Potassium: 4.3 mmol/L (ref 3.3–5.0)
Protein: 7.2 g/dL (ref 6.3–8.3)
Sodium: 139 mmol/L (ref 135–145)
Urea Nitrogen, Blood (BUN): 11 mg/dL (ref 8–22)

## 2019-10-30 LAB — LIPID PANEL WITH DLDL REFLEX
Cholesterol: 239 mg/dL — ABNORMAL HIGH (ref 0–200)
HDL Cholesterol: 65 mg/dL (ref >=35)
LDL Cholesterol Calculation: 153 mg/dL — ABNORMAL HIGH (ref <130)
Non-HDL Cholesterol: 174 mg/dL — ABNORMAL HIGH (ref <=160)
Total Cholesterol: HDL Ratio: 3.7 (ref <4.0)
Triglyceride Level: 105 mg/dL (ref 35–160)

## 2019-10-30 LAB — PSA SCREEN: PSA Screen: 0.4 ng/mL (ref 0.0–4.0)

## 2019-10-30 NOTE — Nursing Note (Signed)
Blood pressure check and pulse done.  Medications reviewed.  Issam Carlyon L Bryer Cozzolino, LVN

## 2019-11-06 ENCOUNTER — Ambulatory Visit: Payer: Commercial Managed Care - PPO | Admitting: Family Medicine

## 2019-11-06 VITALS — BP 132/84 | HR 99 | Wt 198.2 lb

## 2019-11-06 DIAGNOSIS — Z6832 Body mass index (BMI) 32.0-32.9, adult: Secondary | ICD-10-CM

## 2019-11-06 DIAGNOSIS — E785 Hyperlipidemia, unspecified: Secondary | ICD-10-CM

## 2019-11-06 DIAGNOSIS — F439 Reaction to severe stress, unspecified: Secondary | ICD-10-CM

## 2019-11-06 DIAGNOSIS — I10 Essential (primary) hypertension: Secondary | ICD-10-CM

## 2019-11-06 NOTE — Nursing Note (Signed)
Patient WAS wearing a surgical mask  Droplet precautions were followed when caring for the patient.   PPE used by provider during encounter: Surgical mask   Vitals, pharmacy, pain and allergies verified. Latoia Eyster Pierson, LVN

## 2019-11-06 NOTE — Patient Instructions (Addendum)
Cholesterol content per 100 grams:    Chicken:                         85 mg    Pork:                               86 mg     Beef:                               90 mg    Salmon:                          63 mg    Peanuts:                           0 mg    Almonds:                          0 mg     Cashews:                         0 mg    Quinoa:                             0 mg    Egg Whites:                      0 mg    Egg Yolk:                       1,085mg         Would consider adding more soluble fiber like steel cut oats, walnuts , soy ( edamame ) and plant sterols ( various greens: kale, spinach , mixed greens )   To your daily regimen and try to reduce animal proteins.       Romilda Garret : The Stoic Life       21 Vegan Day kickstart App

## 2019-11-06 NOTE — Progress Notes (Signed)
15:00   Chief Complaint   Patient presents with    Blood Pressure       Dustin Ibarra is a 65yr male who presents with a chief complaint of Bp medication .     He is on norvasc 10 mg currently   Past Medical History:   Diagnosis Date    Insomnia, unspecified 10/18/2006    Other diseases of nasal cavity and sinuses(478.19) 10/18/2006    Pain in limb 10/18/2006    right foot    Varicella without mention of complication      Current Outpatient Medications on File Prior to Visit   Medication Sig Dispense Refill    Amlodipine (NORVASC) 10 mg Tablet Take 1 tablet by mouth every day. 90 tablet 3    Cholecalciferol (VITAMIN D) 1,000 unit (25 mcg) Tablet Take 1,000 Units by mouth every day.      Fluticasone (FLONASE) 50 mcg/actuation nasal spray Instill 1 spray into EACH nostril every day. 48 g 3     No current facility-administered medications on file prior to visit.         Pcn (Penicillin) [Penicillins]    Hives    Comment:Given shot for reaction.        Lab Results   Lab Name Value Date/Time    CHOL 239 (H) 10/30/2019 10:16 AM    CHOL 224 (H) 10/09/2013 08:03 AM    LDLC 153 (H) 10/30/2019 10:16 AM    LDLC 145 (H) 10/09/2013 08:03 AM    HDL 65 10/30/2019 10:16 AM    HDL 61 10/09/2013 08:03 AM    TRIG 105 10/30/2019 10:16 AM    TRIG 90 10/09/2013 08:03 AM           Blood pressure 132/84, pulse 99, weight 89.9 kg (198 lb 3.1 oz).      General Appearance: healthy, alert, no distress, pleasant affect, cooperative.  Eyes:  conjunctivae and sclera normal.  Mental Status: Appearance/Cooperation: in no apparent distress, non-toxic, in no respiratory distress and acyanotic, alert and well groomed and dressed  Attitude: pleasant   Behavior :normal  Eye Contact: normal  Attention Span: good  Speech: normal volume, rate, and pitch        (I10) Essential hypertension  (primary encounter diagnosis)  Comment: stable on Norvasc   Plan: see patient instructions.       (E78.5) Hyperlipidemia with target LDL less than 100  Comment: reviewed  labs and he will try to reduce intake of saturated fats and cholesterol   Plan: Lipid Panel with DLDL Reflex        Recheck again in 3 months         (F43.9) Stress  Comment: he has a week coming up and he will try to improve on lifestyle : sleep , exercise and mindfulness   Plan: see patient instructions.       (Z68.32) BMI 32.0-32.9,adult  Comment: recommended he try to increase his fiber intake , reduce processed calorie dense foods     I did review patient's past medical and family/social history, no changes noted.    Barriers to Learning assessed: none. Patient verbalizes understanding of teaching and instructions.     Electronically signed by   Michael Boston MD    Associate Physician

## 2020-01-26 ENCOUNTER — Encounter: Payer: Self-pay | Admitting: Family Medicine

## 2020-01-26 ENCOUNTER — Ambulatory Visit: Payer: Commercial Managed Care - PPO | Admitting: Family Medicine

## 2020-01-26 VITALS — BP 136/80 | HR 75 | Ht 68.5 in | Wt 198.0 lb

## 2020-01-26 DIAGNOSIS — R21 Rash and other nonspecific skin eruption: Secondary | ICD-10-CM

## 2020-01-26 DIAGNOSIS — I10 Essential (primary) hypertension: Secondary | ICD-10-CM

## 2020-01-26 MED ORDER — CLOBETASOL 0.05 % TOPICAL CREAM
TOPICAL_CREAM | Freq: Two times a day (BID) | TOPICAL | 0 refills | Status: AC
Start: 2020-01-26 — End: 2021-01-20

## 2020-01-26 NOTE — Nursing Note (Signed)
Vitals taken, allergies, tobacco history & pharmacy VERIFIED, pain level recorded.     Aerith Canal Webster-Kasper MA

## 2020-01-26 NOTE — Progress Notes (Signed)
11:57   Chief Complaint   Patient presents with    Hypertension     f/U       Dustin Ibarra is a 65yr male who presents with a chief complaint of Bp follow up    rash on legs since going to [facility] for a 3 day weekend.   Warm to the touch , some extension to thigh ( now improved )  He had used some tighter pants in the heat .  He took Zyrtec .  He came back 3 days ago .  No rash prior to traveling .    He did a lot of walking while in [place] , (24,000 steps one day)    He took an OTC  sleep med ( sleep well ) and he woke up as if choking on saliva   He is prone to sleep apnea     Bp 140/85   Currently taking Norvasc 10 mg   On the trip , he ate some beef sandwiches  and drank some beers     Past Medical History:   Diagnosis Date    Insomnia, unspecified 10/18/2006    Other diseases of nasal cavity and sinuses(478.19) 10/18/2006    Pain in limb 10/18/2006    right foot    Varicella without mention of complication      Past Surgical History:   Procedure Laterality Date    PR APPENDECTOMY  1968       Current Outpatient Medications on File Prior to Visit   Medication Sig Dispense Refill    Amlodipine (NORVASC) 10 mg Tablet Take 1 tablet by mouth every day. 90 tablet 3    Cholecalciferol (VITAMIN D) 1,000 unit (25 mcg) Tablet Take 1,000 Units by mouth every day.      Fluticasone (FLONASE) 50 mcg/actuation nasal spray Instill 1 spray into EACH nostril every day. 48 g 3     No current facility-administered medications on file prior to visit.         Pcn (Penicillin) [Penicillins]    Hives    Comment:Given shot for reaction.      Blood pressure 136/80, pulse 75, height 1.74 m (5' 8.5"), weight 89.8 kg (197 lb 15.6 oz).      General Appearance: healthy, alert, no distress, pleasant affect, cooperative.  Eyes:  conjunctivae and sclera normal.  Mental Status: Appearance/Cooperation: in no apparent distress, well developed and well nourished, non-toxic, in no respiratory distress and acyanotic, alert and well groomed and  dressed  Attitude: pleasant   Behavior :normal  Eye Contact: normal  Attention Span: good  Speech: normal volume, rate, and pitch  Mood (pt's report) :Mood pt's report, euthymic, hs of depression   Affect: full and appropriate    Skin: clustered asymmetric, erythematous, follicular based and well demarcated maculopapular on his lower leg and thigh      (R21) Rash/skin eruption  (primary encounter diagnosis)  Comment:   Plan: Clobetasol (TEMOVATE) 0.05 % Cream            (I10) Hypertension, unspecified type  Comment: Norvasc currently   Improved on recheck   Plan: see patient instructions. For more lifestyle       I did review patient's past medical and family/social history, no changes noted.    Barriers to Learning assessed: none. Patient verbalizes understanding of teaching and instructions.     Electronically signed by   Michael Boston MD    Associate Physician

## 2020-01-26 NOTE — Patient Instructions (Addendum)
For Mindfulness training, consider using a meditation App : Waking Up ( with Terrill Mohr )       Podcasts:  The Exam Room                       Rich Roll                         Hypertension    to improve Bp , reduce:   1. Sodium:     Largest sources of sodium in [place] are      - Processed foods account for 75 % of American dietary intake      - Pizza, breads, rolls, crackers, luncheon meats, cold cuts and processed meats      - Canned foods such as soups and vegetables    2. Saturated Fat ( cheese, high-fat dairy , animal fats )    3. Alcohol : men limit to < 3 drinks / day ; women < 2/ day   4. Caffeine, excess weight , tobacco , stress and physical inactivity     Improve Bp by increasing intake of:    1. Potassium rich foods: potatoes, sweet potatoes , fruits ( bananas, cantaloupe, peaches ) ; Vegetables ( squash , broccoli , spinach )   Legumes ( white beans, lentils, soy , lima )    2. Calcium rich foods: Greens ( choose low-oxalate greens like collards, kale, turnip greens, mustard greens and bok choy)   Beans ( white beans, black-eyed peas , etc ) and Fortified non dairy milks .   3. Magnesium rich foods: Nuts ( almonds, cashews, peanuts )    Beans ( black, soy , kidney ) , Avocado, Quinoa    4. Garlic    5 Water only fasting

## 2020-04-02 ENCOUNTER — Encounter: Payer: Self-pay | Admitting: Family Medicine

## 2020-04-02 DIAGNOSIS — L03311 Cellulitis of abdominal wall: Secondary | ICD-10-CM

## 2020-04-02 DIAGNOSIS — H00015 Hordeolum externum left lower eyelid: Secondary | ICD-10-CM

## 2020-04-02 MED ORDER — DOXYCYCLINE HYCLATE 100 MG CAPSULE
100.0000 mg | ORAL_CAPSULE | Freq: Two times a day (BID) | ORAL | 0 refills | Status: AC
Start: 2020-04-02 — End: 2020-04-12

## 2020-04-02 NOTE — Telephone Encounter (Signed)
From: [name]  To: Jorja Loa, MD  Sent: 04/02/2020 10:24 AM PDT  Subject: Possible spider/insect bite question     Hello Dr Antony Haste,  I was at [facility] [place] Sunday night and felt some skin irritation while watching the band but thought I was being bit by a mosquito.  Monday AM I felt a lump under my skin, and then this redness gradually appeared. The lump feels fairly pronounced still - is this anything of real concern?  Thanks so much and have a great day!    [name]

## 2020-04-10 MED ORDER — ERYTHROMYCIN 5 MG/GRAM (0.5 %) EYE OINTMENT
0.2500 [in_us] | TOPICAL_OINTMENT | Freq: Four times a day (QID) | OPHTHALMIC | 0 refills | Status: AC
Start: 2020-04-10 — End: 2020-04-17

## 2020-04-10 NOTE — Addendum Note (Signed)
Addended by: Michael Boston on: 04/10/2020 07:53 AM     Modules accepted: Orders

## 2020-09-06 ENCOUNTER — Ambulatory Visit: Payer: Commercial Managed Care - PPO | Admitting: Family Medicine

## 2020-09-06 VITALS — BP 140/80 | HR 91 | Temp 98.4°F | Resp 20 | Ht 68.0 in | Wt 199.1 lb

## 2020-09-06 DIAGNOSIS — Z125 Encounter for screening for malignant neoplasm of prostate: Secondary | ICD-10-CM

## 2020-09-06 DIAGNOSIS — R399 Unspecified symptoms and signs involving the genitourinary system: Secondary | ICD-10-CM

## 2020-09-06 DIAGNOSIS — Z0000 Encounter for general adult medical examination without abnormal findings: Secondary | ICD-10-CM

## 2020-09-06 DIAGNOSIS — I10 Essential (primary) hypertension: Secondary | ICD-10-CM

## 2020-09-06 DIAGNOSIS — E785 Hyperlipidemia, unspecified: Secondary | ICD-10-CM

## 2020-09-06 DIAGNOSIS — Z23 Encounter for immunization: Secondary | ICD-10-CM

## 2020-09-06 MED ORDER — DOXAZOSIN 2 MG TABLET
2.0000 mg | ORAL_TABLET | Freq: Every day | ORAL | 1 refills | Status: DC
Start: 2020-09-06 — End: 2021-03-07

## 2020-09-06 MED ORDER — AMLODIPINE 5 MG TABLET
5.0000 mg | ORAL_TABLET | Freq: Every day | ORAL | 1 refills | Status: DC
Start: 2020-09-06 — End: 2021-03-07

## 2020-09-06 NOTE — Progress Notes (Signed)
15:02   Chief Complaint   Patient presents with    Medication Follow Up       Dustin Ibarra is a 32yr male who presents with a chief complaint of follow up .  He thinks he had Covid last September because he could not taste.  He might have had Omicron about a week ago , but now feeling better.  He has trouble urinating ,possibly prostate.  Reduced urine output .  He still has a good libido .  Gets up two times  Night .  No burning   He may have some hemorrhoids and he never had a C scope .  Having some swelling of his ankles , possibly from the norvasc   He had 2-3 days of URI like symptoms about a week ago.  He did not do a home test .    Past Medical History:   Diagnosis Date    Insomnia, unspecified 10/18/2006    Other diseases of nasal cavity and sinuses(478.19) 10/18/2006    Pain in limb 10/18/2006    right foot    Varicella without mention of complication      Current Outpatient Medications on File Prior to Visit   Medication Sig Dispense Refill    Amlodipine (NORVASC) 10 mg Tablet Take 1 tablet by mouth every day. 90 tablet 3    Cholecalciferol (VITAMIN D) 1,000 unit (25 mcg) Tablet Take 1,000 Units by mouth every day.      Clobetasol (TEMOVATE) 0.05 % Cream Apply to the affected area 2 times daily. 30 g 0    Fluticasone (FLONASE) 50 mcg/actuation nasal spray Instill 1 spray into EACH nostril every day. 48 g 3     No current facility-administered medications on file prior to visit.           Pcn (Penicillin) [Penicillins]    Hives    Comment:Given shot for reaction.    Family History   Problem Relation Name Age of Onset    Heart Mother          CHF    Cancer Mother          breast    Hypertension Father      Stroke Father          hemorrhagic       Immunization History   Administered Date(s) Administered    COVID-19 Ad26.COV2.S PF 0.5 mL (Janssen/Johnson & Johnson) 11/27/2019    Influenza Vaccine (Fluarix) 06/13/2008    Influenza Vaccine (Fluzone/fluarix Prefill Syringe) 05/30/2010, 05/21/2011     Influenza Vaccine, Quadrivalent (Fluarix/Flulaval/Fluzone Prefill Syringe) 05/02/2018, 06/19/2019, 09/06/2020    Tdap (Adacel ) 10/18/2006, 11/09/2016    Zoster Vaccine (recombinant) 04/11/2018, 10/23/2019   Pended Date(s) Pended    COVID-19 mRNA PF 30 mcg/0.3 mL (Pfizer) (PURPLE CAP) 09/06/2020   Deferred Date(s) Deferred    Zoster Vaccine (recombinant) 11/09/2016       Blood pressure 140/80, pulse 91, temperature 36.9 C (98.4 F), temperature source Temporal, resp. rate 20, height 1.727 m (5\' 8" ), weight 90.3 kg (199 lb 1.2 oz).      General Appearance: healthy, alert, no distress, pleasant affect, cooperative.  Eyes:  conjunctivae and sclera normal.  Mouth: normal.  Neck:  Neck supple. No adenopathy, thyroid symmetric, normal size.  Heart:  normal rate and regular rhythm, no murmurs, clicks, or gallops.  Lungs: clear to auscultation.  Abdomen: symmetric, no masses palpable, no organomegaly, bowel sounds normal, no bruits heard, liver span normal to percussion, soft,  non-tender, overweight .  Extremities:  peripheral edema 1+.  Rectal: stool guaiac negative and prostate 2+.  Neuro: speech normal, mental status intact, muscle tone normal, muscle strength normal.  Mental Status: Appearance/Cooperation: in no apparent distress, non-toxic, in no respiratory distress and acyanotic, alert and well groomed and dressed  Attitude: pleasant   Behavior :normal  Eye Contact: normal  Attention Span: good  Speech: normal volume, rate, and pitch  Musculoskeletal: back:negative findings: symmetric, no tenderness to percussion or palpation      (Z00.00) Routine general medical examination at a health care facility  (primary encounter diagnosis)  Comment:   Plan: follow up 6 months     (I10) Hypertension, unspecified type  Comment: having edema so will reduce   Plan: Amlodipine (NORVASC) 5 mg Tablet        Add Cardura     (Z12.5) Screening PSA (prostate specific antigen)  Comment:   Plan: PSA Screen            (E78.5)  Hyperlipidemia with target LDL less than 100  Comment:   Plan: Lipid Panel with DLDL Reflex, Comprehensive         Metabolic Panel            (R39.9) Lower urinary tract symptoms (LUTS)  Comment:   Plan: Urinalysis and Culture if Ind            (Z23) Need for COVID-19 vaccine  Comment:   Plan: COVID-2019 VACCINE (PFIZER): PURPLE-CAP            (Z23) Flu vaccine need  Comment:   Plan: INFLUENZA VACCINE (87 MONTH OLD AND OLDER),         QUADRIVALENT, NO LATEX, NO PRESERVATIVE                  I did review patient's past medical and family/social history, no changes noted.    Barriers to Learning assessed: none. Patient verbalizes understanding of teaching and instructions.     Electronically signed by   Michael Boston MD    Associate Physician

## 2020-09-06 NOTE — Nursing Note (Signed)
The patient has received a flu vaccine previously: yes    The Inactivated Influenza Vaccine VIS document for the flu injection was given to patient to review. All questions were referred to the physician.    The Screening Checklist for Contraindications to Inactivated Injectable Influenza Vaccination was completed:   Is the patient sick today?   Does the patient have an allergy to a component of the vaccine?   Has the patient ever had a serious reaction to the influenza vaccine in the past?   Has the patient ever had Guillain-Barre syndrome?  Did the patient answer "Yes" to any of the screening questions? No - The Influenza Vaccine was administered per Policy 2041 using the standing order.       Malin Cervini, MA

## 2020-09-06 NOTE — Nursing Note (Signed)
Vitals taken and pharmacy verified.   Patient WAS wearing a surgical mask.   Airborne precautions were followed when caring for the patient.   PPE used by provider during encounter: surgical mask and face shield.    Mikey Maffett, MA 1

## 2020-09-06 NOTE — Patient Instructions (Addendum)
Do fasting labs      Hypertension    to improve Bp , reduce:   1. Sodium:     Largest sources of sodium in [place] are      - Processed foods account for 75 % of American dietary intake      - Pizza, breads, rolls, crackers, luncheon meats, cold cuts and processed meats      - Canned foods such as soups and vegetables    2. Saturated Fat ( cheese, high-fat dairy , animal fats )    3. Alcohol : men limit to < 3 drinks / day ; women < 2/ day   4. Caffeine, excess weight , tobacco , stress and physical inactivity     Improve Bp by increasing intake of:    1. Potassium rich foods: potatoes, sweet potatoes , fruits ( bananas, cantaloupe, peaches ) ; Vegetables ( squash , broccoli , spinach )   Legumes ( white beans, lentils, soy , lima )    2. Calcium rich foods: Greens ( choose low-oxalate greens like collards, kale, turnip greens, mustard greens and bok choy)   Beans ( white beans, black-eyed peas , etc ) and Fortified non dairy milks .   3. Magnesium rich foods: Nuts ( almonds, cashews, peanuts )    Beans ( black, soy , kidney ) , Avocado, Quinoa    4. Garlic    5 Water only fasting            For Mindfulness training, consider using a meditation App : Waking Up ( with Terrill Mohr )    Check out the [employer] Romilda Garret ) for anxiety , stress prevention .

## 2020-09-17 ENCOUNTER — Ambulatory Visit: Payer: Commercial Managed Care - PPO | Attending: Family Medicine

## 2020-09-17 DIAGNOSIS — E785 Hyperlipidemia, unspecified: Secondary | ICD-10-CM

## 2020-09-17 DIAGNOSIS — R399 Unspecified symptoms and signs involving the genitourinary system: Secondary | ICD-10-CM

## 2020-09-17 DIAGNOSIS — Z125 Encounter for screening for malignant neoplasm of prostate: Secondary | ICD-10-CM | POA: Insufficient documentation

## 2020-09-17 LAB — PSA SCREEN: PSA Screen: 0.4 ng/mL (ref 0.0–4.0)

## 2020-09-17 LAB — LIPID PANEL WITH DLDL REFLEX
Cholesterol: 276 mg/dL — ABNORMAL HIGH (ref 0–200)
HDL Cholesterol: 62 mg/dL (ref >=35)
LDL Cholesterol Calculation: 183 mg/dL — ABNORMAL HIGH (ref <130)
Non-HDL Cholesterol: 214 mg/dL — ABNORMAL HIGH (ref <=160)
Total Cholesterol: HDL Ratio: 4.5 — ABNORMAL HIGH (ref <4.0)
Triglyceride Level: 154 mg/dL (ref 35–160)

## 2020-09-17 LAB — COMPREHENSIVE METABOLIC PANEL
Alanine Transferase (ALT): 23 U/L (ref 6–63)
Albumin: 4 g/dL (ref 3.4–4.8)
Alkaline Phosphatase (ALP): 69 U/L (ref 35–115)
Aspartate Transaminase (AST): 22 U/L (ref 15–43)
Bilirubin Total: 0.8 mg/dL (ref 0.3–1.3)
Calcium: 9.5 mg/dL (ref 8.6–10.5)
Carbon Dioxide Total: 25 mmol/L (ref 24–32)
Chloride: 102 mmol/L (ref 95–110)
Creatinine Serum: 0.78 mg/dL (ref 0.44–1.27)
E-GFR Creatinine (Male): 99 mL/min/{1.73_m2}
Glucose: 86 mg/dL (ref 70–99)
Potassium: 4.1 mmol/L (ref 3.3–5.0)
Protein: 6.8 g/dL (ref 6.3–8.3)
Sodium: 137 mmol/L (ref 135–145)
Urea Nitrogen, Blood (BUN): 8 mg/dL (ref 8–22)

## 2020-09-17 LAB — URINALYSIS AND CULTURE IF IND
Bilirubin Urine: NEGATIVE
Glucose Urine: NEGATIVE mg/dL
Ketones: NEGATIVE mg/dL
Leuk Esterase: NEGATIVE
Nitrite Urine: NEGATIVE
Occult Blood Urine: NEGATIVE mg/dL
Protein Urine: 30 mg/dL — AB
RBC: 4 /[HPF] — ABNORMAL HIGH (ref 0–2)
Specific Gravity, Urine: 1.02 (ref 1.002–1.030)
Squamous EPI: 1 /[HPF] (ref <=10)
Urobilinogen: NEGATIVE mg/dL (ref ?–2.0)
WBC, Urine: 1 /[HPF] (ref 0–5)
pH URINE: 7 (ref 4.8–7.8)

## 2020-09-17 NOTE — Addendum Note (Signed)
Addended by: Michael Boston on: 09/17/2020 01:12 PM     Modules accepted: Orders

## 2020-09-18 ENCOUNTER — Ambulatory Visit: Payer: Commercial Managed Care - PPO

## 2020-09-24 ENCOUNTER — Ambulatory Visit: Payer: Commercial Managed Care - PPO

## 2020-09-24 DIAGNOSIS — Z23 Encounter for immunization: Secondary | ICD-10-CM

## 2020-09-24 NOTE — Progress Notes (Signed)
Patient WAS wearing a surgical mask  Contact, Droplet and Airborne precautions were followed when caring for the patient.   PPE used by provider during encounter: Surgical mask, N95 mask, Face Shield/Goggles and Gloves      Influenza Vaccine Documentation:  The patient is receiving their Influenza Vaccine today: No    COVID-19 Vaccine Documentation:  The patient acknowledges receipt of the Fact Sheet for Recipients and Caregivers, which includes information about the risks and benefits of the vaccine and available alternatives, for the following vaccine manufacturer: Pfizer-BioNtech     The patient or person named in permission was informed the FDA has authorized the [employer] ([place]) COVID-19 Vaccine as a 2-dose primary series for ages 62 and older; that the FDA has authorized the emergency use of the Pfizer COVID-19 Vaccine as an additional primary series dose for immunocompromised individuals and as a booster dose given after the completion of the primary series for ages 16 and older, which is not FDA-approved; and that the patient may accept or refuse the vaccine.     Has the patient received a COVID vaccine previously: Yes   Immunization History   Administered Date(s) Administered        COVID-19 Ad26.COV2.S PF 0.5 mL (Janssen/Johnson & Johnson)     11/27/2019          Is the patient receiving a 3rd or Booster dose today? Yes, Booster dose     Confirmed the patient's last dose of Pfizer/Moderna was administered at least 5 months ago [place] was administered at least 2 months ago? Yes       Pre-Screening/Eligibility Questions:  1. Does the patient have any of the following absolute contraindications for receiving the COVID-19 Vaccine?   No contraindications  Did the patient indicate a contraindication? No    2. Does the patient have a history of Myocarditis or Pericarditis or has the patient been told they have had inflammation of their heart or of the sac around their heart?  No    3. In the past 90  days, has the patient been diagnosed with Multisystem Inflammatory Syndrome (MIS-A or MIS-C) after a COVID-19 Infection?   No    4. Does the patient have any of the following reasons to defer the vaccine?   No deferrals    5. In the past 90 days, has the patient received Monoclonal Antibody or Convalescent Plasma treatment for COVID-19?   No    6. Does the patient have any of these clinic conditions or history?   No other clinical conditions  Did the patient indicate a clinical consideration?  No    7. Has the patient received a dermal filler injection?   No    8. Does the patient have any of the following conditions that warrant a longer post-vaccine observation period (30 minutes)?   No conditions to warrant longer observation  Did the patient indicate a condition warranting a longer post-vaccine observation period? No - Patient was informed a 15 minute observation period is recommended after the vaccine to watch for a reaction.     The patient is eligible to receive the COVID-19 Vaccine at this time? Yes     All patient questions were answered and the patient agrees to receive the COVID-19 vaccine today.   Vaccine prepared and administered according to the current Emergency Use Authorization and Fact Sheet for Healthcare Providers Administering Vaccine.  Patient given vaccine card and discharge instructions with information about the Fact Sheet for Recipients and  Caregivers and the v-safe program.      COVID-19 Vaccine Observation:  The patient was observed for 15 minutes for immediate reactions to the vaccine per protocol. No reaction was observed.      Narcisa Ganesh, LVN

## 2020-10-31 ENCOUNTER — Ambulatory Visit: Payer: Commercial Managed Care - PPO | Admitting: Family Medicine

## 2020-10-31 ENCOUNTER — Encounter: Payer: Self-pay | Admitting: Family Medicine

## 2020-10-31 VITALS — BP 131/78 | HR 82 | Temp 97.8°F | Wt 195.5 lb

## 2020-10-31 DIAGNOSIS — H6122 Impacted cerumen, left ear: Secondary | ICD-10-CM

## 2020-10-31 NOTE — Nursing Note (Signed)
Spray bottle used,  L ear lavage.Tiana Loft, MA I

## 2020-10-31 NOTE — Progress Notes (Signed)
CHIEF COMPLAINT: wax build up    SUBJECTIVE:   Dustin Ibarra is a 53yr year old male presents for wax build up. Started 2 weeks ago, work for the [employer], put in Conservation officer, historic buildings plugs, right ear clear and feels good after pt placed debrox.  left ear feels congested still. Some hearing loss. No fever, cough, sore throat. Had ear lavage 4 years ago.       History: I reviewed and entered the patient's past medical and family/social history as indicated  Current Outpatient Medications   Medication Sig    Amlodipine (NORVASC) 5 mg Tablet Take 1 tablet by mouth every day. (blood pressure)    Cholecalciferol (VITAMIN D) 1,000 unit (25 mcg) Tablet Take 1,000 Units by mouth every day.    Clobetasol (TEMOVATE) 0.05 % Cream Apply to the affected area 2 times daily.    Doxazosin (CARDURA) 2 mg Tablet Take 1 tablet by mouth every day at bedtime.    Fluticasone (FLONASE) 50 mcg/actuation nasal spray Instill 1 spray into EACH nostril every day.     No current facility-administered medications for this visit.     Social History     Tobacco Use   Smoking Status Never Smoker   Smokeless Tobacco Never Used         ROS:   Negative except for above in HPI    OBJECTIVE:   BP 131/78 (SITE: left arm, Cuff Size: regular)   Pulse 82   Temp 36.6 C (97.8 F) (Tympanic)   Wt 88.7 kg (195 lb 8.8 oz)   BMI 29.73 kg/m    Constitutional: Vitals reviewed. Appears well-developed and well-nourished.   Psychiatric: Normal mood and affect. Behavior is normal. Judgment and thought content normal.   HENT:  Normocephalic.  EOM are grossly normal. Pupils are equal, round, and reactive to light. Conjunctiva noninjected, and no icterus.  External ears normal. Left TM-unable to visualize prior to lavage due to cerumen impaction. Right TM- Cardiovascular: Normal rate, regular rhythm, normal heart sounds without murmur   Pulmonary/Chest: Effort normal and breath sounds normal. No wheezes rales or rhonchi.   Abdominal: Soft. Bowel sounds are normal. Non  distended. No tenderness.         ASSESSMENT AND PLAN:  1. Impacted cerumen of left ear  - Remove Ear Wax/Lavage-Back Office Staff  -  Ear exam repeated post lavage- pt reports feeling much better, symptoms resolved. TM visualized on left. WNL.        Plan was formulated with patient who is aware of the risks, benefits and alternatives of each treatment option. Patient demonstrated understanding and agreement of plan. They were instructed to contact the clinic for follow up if there are deviations/questions to this plan.    Patient was wearing a surgical mask  PPE used by provider during encounter: Surgical mask     Electronically signed by  Dustin Fennel MD  Associate Physician, [place] [facility]   [place] [address]      Dear Dustin Ibarra,    If you are reviewing this progress note and have questions about the meaning or medical terms being used, please schedule an appointment or bring it up at your next follow-up appointment.  Medical notes are a communication tool between medical professionals and require medical terms/abbreviations to be used for efficiency. You can also look up terms via on-line medical dictionaries such as Medline Plus at [email or web address].org/.html

## 2020-10-31 NOTE — Nursing Note (Signed)
Vital signs taken,tobacco,allergies,pharmacy verified, screened for pain.  Kylina Vultaggio, MA    Patient WAS wearing a surgical mask  Droplet precautions were followed when caring for the patient.   PPE used by provider during encounter: Surgical mask

## 2021-03-07 ENCOUNTER — Other Ambulatory Visit: Payer: Self-pay | Admitting: Family Medicine

## 2021-03-07 DIAGNOSIS — I10 Essential (primary) hypertension: Secondary | ICD-10-CM

## 2021-06-11 ENCOUNTER — Encounter: Payer: Self-pay | Admitting: Family Medicine

## 2021-06-11 NOTE — Telephone Encounter (Signed)
From: [name]  To: Jorja Loa, MD  Sent: 06/11/2021 10:41 AM PDT  Subject: Blood work for cholesterol     Hello Dr Cheral Bay,  Could you pls order some blood work to check my cholesterol levels?  We discussed starting a statin but never followed up.   Thank you,  Elta Guadeloupe

## 2021-06-13 ENCOUNTER — Ambulatory Visit: Payer: Commercial Managed Care - PPO | Attending: Family Medicine

## 2021-06-13 DIAGNOSIS — E785 Hyperlipidemia, unspecified: Secondary | ICD-10-CM | POA: Insufficient documentation

## 2021-06-13 LAB — LIPID PANEL WITH DLDL REFLEX
Cholesterol: 228 mg/dL — ABNORMAL HIGH (ref <200)
HDL Cholesterol: 65 mg/dL (ref >40)
Non-HDL Cholesterol: 163 mg/dL — ABNORMAL HIGH (ref <=150)
Total Cholesterol: HDL Ratio: 3.5 (ref <4.0)
Triglyceride: 171 mg/dL — ABNORMAL HIGH (ref <150)

## 2021-06-13 LAB — LDL CHOLESTEROL (DIRECT): LDL Cholesterol (Direct): 141 mg/dL — ABNORMAL HIGH (ref <100)

## 2022-01-07 ENCOUNTER — Ambulatory Visit: Payer: 59 | Admitting: Family Medicine

## 2022-01-07 VITALS — BP 120/82 | HR 98 | Temp 97.6°F | Ht 68.0 in | Wt 198.2 lb

## 2022-01-07 DIAGNOSIS — Z0000 Encounter for general adult medical examination without abnormal findings: Secondary | ICD-10-CM

## 2022-01-07 DIAGNOSIS — Z125 Encounter for screening for malignant neoplasm of prostate: Secondary | ICD-10-CM

## 2022-01-07 DIAGNOSIS — Z1283 Encounter for screening for malignant neoplasm of skin: Secondary | ICD-10-CM

## 2022-01-07 DIAGNOSIS — E785 Hyperlipidemia, unspecified: Secondary | ICD-10-CM

## 2022-01-07 DIAGNOSIS — I10 Essential (primary) hypertension: Secondary | ICD-10-CM

## 2022-01-07 NOTE — Patient Instructions (Addendum)
All greens: 100 cal / lb   Fruit: 300 cal / lb  Potatoes : 500 cal/ lb  Intact whole grains : 500 cal/ lb   Legumes 600 cal/ lb    Caution with nuts/ seeds : 2800 cal / lb , so limit to one handful a day .  Avocado 750 cal / lb so limit to 1/4 a day   Avoid all oils 4,000 cal/ lb , 120 cal : Tbsp      Try to add some Physical activity    Goal 30 minutes , 5 days a week of moderate activity ( Still able to talk but not sing )    But start small : 10 minutes , 2-3 days a week then gradually increase it up .    Hypertension    to improve Bp , reduce:   1. Sodium:     Largest sources of sodium in [place] are      - Processed foods account for 75 % of American dietary intake      - Pizza, breads, rolls, crackers, luncheon meats, cold cuts and processed meats      - Canned foods such as soups and vegetables    2. Saturated Fat ( cheese, high-fat dairy , animal fats )    3. Alcohol : men limit to < 3 drinks / day ; women < 2/ day   4. Caffeine, excess weight , tobacco , stress and physical inactivity     Improve Bp by increasing intake of:    1. Potassium rich foods: potatoes, sweet potatoes , fruits ( bananas, cantaloupe, peaches ) ; Vegetables ( squash , broccoli , spinach )   Legumes ( white beans, lentils, soy , lima )    2. Calcium rich foods: Greens ( choose low-oxalate greens like collards, kale, turnip greens, mustard greens and bok choy)   Beans ( white beans, black-eyed peas , etc ) and Fortified non dairy milks .   3. Magnesium rich foods: Nuts ( almonds, cashews, peanuts )    Beans ( black, soy , kidney ) , Avocado, Quinoa    4. Garlic    5 Water only fasting         In a 2014 meta-analysis including over 1.7 million subjects showed that for every 10 grams of dietary fiber per day  , the risk of premature death was lowered by 11 %     Fiber Score:    Average American is 15grams  day .   Goal would be 50 grams per day     Soluble fiber: oats, barley and legumes , dissolves in water and helps control cholesterol    Insoluble fiber: wheat, brown rice, bran, fruits with skin     Beans/ Lentils    1/2 cup                                           7 points    Soy     1 cup milk                                     1 point    1/2 cup tofu       Vegetables:  4 points    1 cup                                               Lettuce: 2 points    Fruit    Medium size ( apple, banana )      3 points    Grains                                               1 point: processed                                                            (white bread, bagel,white rice)                                                             2 points: whole-wheat                                         Processed grains ( whole-grain bread,                                                  Whole grain pasta)                                                             3 points: whole grains                                                 (Whole grain cereal, brown rice )                                                                 4 points: oatmeal per 1/4 cup                                                         8 points: bran  Meat, fish and poultry                     0 points    Eggs and dairy                               0 points                      Food groups high in fiber ( > 20 % daily value ) : beans, split peas,lentils, avocados, bran cereals , raspberries, blackberries, pears, papaya, dried fruits, flaxseeds, some whole grains and whole wheat pasta.   moderately high (5 to 20 % of DV) : blueberries, strawberries, most other fruit, most vegetables, grains, whole grain bread, popcorn, mushrooms, nuts and seeds.    For Gut Health:  F-GOALS       Fruits and Fermented    Greens and whole Grains    Omega seeds: Flax, Chia , Hemp    Aromatics: garlic , onion and shallots     Legumes    Shrooms      Sea Veg       Sulforaphanes : cruciferous veg ( Broccoli Sprouts )       Fiber Fueled by Dr Anabel Bene

## 2022-01-07 NOTE — Nursing Note (Signed)
Patient vitals taken, screened for pain, allergies verified and pharmacy confirmed.     Adjoa Althouse, MA 1

## 2022-01-07 NOTE — Progress Notes (Signed)
e14:02  Chief Complaint   Patient presents with    Physical       Dustin Ibarra is a 14yrmale who presents with a chief complaint of    CPE .     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    Past Medical History:   Diagnosis Date    Insomnia, unspecified 10/18/2006    Other diseases of nasal cavity and sinuses(478.19) 10/18/2006    Pain in limb 10/18/2006    right foot    Varicella without mention of complication      Past Surgical History:   Procedure Laterality Date    PR APPENDECTOMY  1968     Current Outpatient Medications on File Prior to Visit   Medication Sig Dispense Refill    Amlodipine (NORVASC) 5 mg Tablet TAKE 1 TABLET BY MOUTH EVERY DAY. (BLOOD PRESSURE) 90 tablet 3    Cholecalciferol (VITAMIN D) 1,000 unit (25 mcg) Tablet Take 1,000 Units by mouth every day.      Doxazosin (CARDURA) 2 mg Tablet TAKE 1 TABLET BY MOUTH EVERYDAY AT BEDTIME 90 tablet 3    Fluticasone (FLONASE) 50 mcg/actuation nasal spray Instill 1 spray into EACH nostril every day. 48 g 3     No current facility-administered medications on file prior to visit.           Pcn (Penicillin) [Penicillins]    Hives    Comment:Given shot for reaction.      Review of Systems -   ENT:   negative           Eye:  glasses or contacts  Neck: no pain, no swollen glands, normal ROM  Lungs: no cough .  CV: no SOB or chest pain   GI: negative  GU: negative  Musculoskelatal: negative  Neuro: negative  SKin: negative  Psych: Stress   Sleep       Lifestyle Vital Signs:    Sleep ( < 7 hours , > 9 )    6     Tobacco   N     Alcohol  ( > 14 / week , or > 3 / day )     3 days    2-3 IPAs     Emotional well- being , PHQ2 0/6       Diet: ( 5 fruits and Vegetables / day )    Fruit: 2-3  Veg  2     Exercise 150 minutes / week     walking      BMI  < 25 , > 18     30      Stress Level      Moderate to Severe.        Blood pressure 120/82, pulse 98, temperature 36.4 C (97.6 F), temperature source Temporal, height 1.727 m ('5\' 8"'$ ), weight 89.9 kg (198 lb 3.1 oz).      General Appearance: healthy, alert, no distress, pleasant affect, cooperative.  Eyes:  conjunctivae and sclera normal.  Ears:  normal TMs and canal and external inspection of ears show no abnormality.  Nose:  normal.  Mouth: normal.  Neck:  Neck supple. No adenopathy, thyroid symmetric, normal size.  Heart:  normal rate and regular rhythm, no murmurs, clicks, or gallops.  Lungs: clear to auscultation.  Abdomen: symmetric, no masses palpable, no organomegaly, bowel sounds normal, no bruits heard, liver span normal to percussion, soft, non-tender, overweight.  Extremities:  no cyanosis, clubbing, or edema.  Neuro: speech normal, mental status intact, muscle tone normal, muscle strength normal.  Mental Status: Appearance/Cooperation: well developed and well nourished, non-toxic, in no respiratory distress and acyanotic, alert, and well groomed and dressed  Attitude: pleasant   Behavior :normal  Eye Contact: normal  Attention Span: good  Speech: normal volume, rate, and pitch  Musculoskeletal: back:negative findings: symmetric, no tenderness to percussion or palpation      (Z00.00) Routine general medical examination at a health care facility  (primary encounter diagnosis)  Comment:   Plan: follow up yearly     (Z12.5) Screening PSA (prostate specific antigen)  Comment:   Plan: PSA Screen            (E78.5) Hyperlipidemia with target LDL less than 70  Comment:   Plan: Lipid Panel with DLDL Reflex, Comprehensive         Metabolic Panel            (I10) Hypertension, unspecified type  Comment: improved on recheck  Plan: Comprehensive Metabolic Panel                (Z12.83) Skin cancer screening  Comment:   Plan: Dermatology Clinic Referral                    I did review patient's past medical and family/social history, no changes  noted.    Barriers to Learning assessed: none. Patient verbalizes understanding of teaching and instructions.     Electronically signed by   Michael Boston MD    Associate Physician

## 2022-03-18 ENCOUNTER — Telehealth: Payer: Self-pay | Admitting: Family Medicine

## 2022-03-18 NOTE — Telephone Encounter (Signed)
Leng Montesdeoca is a 61yrold male  3 patient identifiers used.  Per:   patient.    Disposition: Advice given per Clear Triage protocol  Appointment given per Clear Triage protocol  Per:   patient verbalizes agreement to plan. Agrees to callback with any increase in symptoms/concerns or questions.    See Assessment Below  ClearTriage Note:     Patient reports small amount of blood this AM approximately 1 hour ago, with bowel movement. Denies clots, no visible blood in stool, small area of blood noted in toilet water.  Denies dizziness, no active bleeding reported.  Pt reports eating beets yesterday.  ER precautions reinforced. Appointment scheduled tomorrow with alterate provider at alternate clinic.     Protocol Used: Rectal Bleeding (Adult)  Protocol-Based Disposition: See PCP or Video Visit within 24 Hours    Video visit offered but caller response not recorded    Positive Triage Questions:  * MODERATE rectal bleeding (small blood clots, passing blood without stool, or toilet water turns red)  * Rectal bleeding is minimal (e.g., blood just on toilet paper, a few drops in toilet bowl)    Care Advice Discussed:  * Reasons To Call Back      - Bleeding increases in amount      - Bleeding occurs 3 or more times after treatment begins      - You become worse      - Dizziness occurs      - Bleeding increases    Negative Triage Questions:  * Shock suspected (e.g., cold/pale/clammy skin, too weak to stand, low BP, rapid pulse)  * Difficult to awaken or acting confused (e.g., disoriented, slurred speech)  * Passed out (i.e., lost consciousness, collapsed and was not responding)  * [1] Vomiting AND [2] contains red blood or black ("coffee ground") material  (Exception: Few red streaks in vomit that only happened once.)  * Sounds like a life-threatening emergency to the triager  * SEVERE rectal bleeding (large blood clots; constant or on and off bleeding)  * Severe dizziness (e.g., unable to stand, requires support to walk, feels  like passing out now)  * [1] MODERATE rectal bleeding (small blood clots, passing blood without stool, or toilet water turns red) AND [2] more than once a day  * Pale skin (pallor) of new-onset or worsening  * Black or tarry bowel movements  (Exception: Chronic-unchanged black-grey BMs AND is taking iron pills or Pepto-Bismol.)  * [1] Constant abdominal pain AND [2] present > 2 hours  * Rectal foreign body (i.e., now or within past week;  inserted or swallowed)  * High-risk adult (e.g., prior surgery on aorta, abdominal aortic aneurysm)  * Taking Coumadin (warfarin) or other strong blood thinner, or known bleeding disorder (e.g., thrombocytopenia)  * Known cirrhosis of the liver (or history of liver failure or ascites)  * [1] Colonoscopy AND [2] in past 72 hours  * Patient sounds very sick or weak to the triager  * MILD rectal bleeding (more than just a few drops or streaks)    JConan Bowens RN, BSN  PCN Triage

## 2022-03-18 NOTE — Telephone Encounter (Signed)
Emergency Advice / [facility]:    Reason for Call:  Patient experiencing symptoms of blood in stool.  Notice this morning.     Ph# [phone]      Bing Ree   PSR II   [facility]

## 2022-03-19 ENCOUNTER — Ambulatory Visit: Payer: 59 | Admitting: Family Medicine

## 2022-03-19 ENCOUNTER — Telehealth: Payer: Self-pay | Admitting: Family Medicine

## 2022-03-19 NOTE — Telephone Encounter (Signed)
Dustin Ibarra is a 61yrold male  3 patient identifiers used.  Per:   patient.    Disposition: Advice given per ClearTriage protocol  Appointment given per ClearTriage protocol  Per:   patient verbalizes agreement to plan. Agrees to callback with any increase in symptoms/concerns or questions.    See Assessment Below  ClearTriage Note:   pt reporting feeling unwell starting yesterday - + covid today  low grade fever, nasal congestion, fatigue - denies SOB and CP    Protocol Used: COVID-19 - Diagnosed or Suspected (Adult)  Protocol-Based Disposition: Home Care    Positive Triage Question:  * [1] [id number] by positive lab test (e.g., PCR, rapid self-test kit) AND [2] mild symptoms (e.g., cough, fever, others) AND [[4]no complications or SOB    Care Advice Discussed:  * Humidifier      - If the air is dry, use a humidifier in the bedroom.      - Dry air makes coughs worse.  * Coughing Spells      - Drink warm fluids. Inhale warm mist. This can help relax the airway and also loosen up phlegm.      - Suck on cough drops or hard candy to coat the irritated throat.  * Pain and Fever Medicines      - For pain or fever relief, take either acetaminophen or ibuprofen.      - They are over-the-counter (OTC) drugs that help treat both fever and pain. You can buy them at the drugstore.      - Treat fevers above 101 F (38.3 C). The goal of fever therapy is to bring the fever down to a comfortable level. Remember that fever medicine usually lowers fever 2 degrees F (1 - 1 1/2 degrees C).      - Acetaminophen - Regular Strength Tylenol: Take 650 mg (two 325 mg pills) by mouth every 4 to 6 hours as needed. Each Regular Strength Tylenol pill has 325 mg of acetaminophen. The most you should take is 10 pills a day (3,250 mg total). Note: In [place] the maximum is 12 pills a day (3,900 mg total).      - Acetaminophen - Extra Strength Tylenol: Take 1,000 mg (two 500 mg pills) every 6 to 8 hours as needed. Each Extra Strength Tylenol  pill has 500 mg of acetaminophen. The most you should take is 6 pills a day (3,000 mg total). Note: [place] the maximum is 8 pills a day (4,000 mg total).      - Ibuprofen (e.g., Motrin, Advil): Take 400 mg (two 200 mg pills) by mouth every 6 hours. The most you should take is 6 pills a day (1,200 mg total).  * Pain and Fever Medicines - Extra Notes and Warnings  * FAQ - When Can I Stop Home Isolation If I Am Sick With COVID-19?      - Isolation time at home depends on how severe the COVID-19 symptoms and if the person has a weak immune system. Day 0 is the day symptoms began.      - People with mild COVID-19 can stop home isolation after 5 days if (1) fever has been gone for 24 hours (without using fever medicine) AND (2) symptoms are better. Continue to wear a well-fitted mask for a full 10 days when around others.      - Notes: People who are moderately to severely ill with COVID-19, or who have a weak immune system will  need to stay home (isolate) for at least 10 days. Talk your doctor (or NP/PA) before ending isolation.  * Reasons To Call Back      - Fever over 103 F (39.4 C)      - Fever lasts over 3 days      - Fever returns after being gone for 24 hours      - Chest pain or difficulty breathing occurs      - You become worse    Negative Triage Questions:  * SEVERE difficulty breathing (e.g., struggling for each breath, speaks in single words)  * Difficult to awaken or acting confused (e.g., disoriented, slurred speech)  * Bluish (or gray) lips or face now  * Shock suspected (e.g., cold/pale/clammy skin, too weak to stand, low BP, rapid pulse)  * Sounds like a life-threatening emergency to the triager  * SEVERE or constant chest pain or pressure  (Exception: Mild central chest pain, present only when coughing.)  * MODERATE difficulty breathing (e.g., speaks in phrases, SOB even at rest, pulse 100-120)  * [1] Headache AND [2] stiff neck (can't touch chin to chest)  * Oxygen level (e.g., pulse oximetry) 90  percent or lower  * Chest pain or pressure  (Exception: MILD central chest pain, present only when coughing.)  * [1] Drinking very little AND [2] dehydration suspected (e.g., no urine > 12 hours, very dry mouth, very lightheaded)  * Patient sounds very sick or weak to the triager  * MILD difficulty breathing (e.g., minimal/no SOB at rest, SOB with walking, pulse < 100)  * Fever > 103 F (39.4 C)  * [1] Fever > 101 F (38.3 C) AND [2] age > 37 years  * [1] Fever > 100.0 F (37.8 C) AND [2] bedridden (e.g., CVA, chronic illness, recovering from surgery)  * Oxygen level (e.g., pulse oximetry) 91 to 94 percent  * [1] HIGH RISK patient (e.g., weak immune system, age > 64 years, obesity with BMI 30 or higher, pregnant, chronic lung disease or other chronic medical condition) AND [2] COVID symptoms (e.g., cough, fever)  (Exceptions: Already seen by PCP and no new or worsening symptoms.)  * [1] HIGH RISK patient AND [2] influenza is widespread in the community AND [3] ONE OR MORE respiratory symptoms: cough, sore throat, runny or stuffy nose  * [1] HIGH RISK patient AND [2] influenza exposure within the last 7 days AND [3] ONE OR MORE respiratory symptoms: cough, sore throat, runny or stuffy nose  * Fever present > 3 days (72 hours)  * [1] Fever returns after gone for over 24 hours AND [2] symptoms worse or not improved  * [1] Continuous (nonstop) coughing interferes with work or school AND [2] no improvement using cough treatment per Care Advice  * [1] COVID-19 infection suspected by caller or triager AND [2] mild symptoms (cough, fever, or others) AND [3] negative COVID-19 rapid test  * Cough present > 3 weeks

## 2022-03-20 ENCOUNTER — Ambulatory Visit: Payer: 59 | Admitting: Emergency Medicine

## 2022-03-20 DIAGNOSIS — U071 COVID-19: Secondary | ICD-10-CM

## 2022-03-20 MED ORDER — PAXLOVID 300 MG (150 MG X 2)-100 MG TABLETS IN A DOSE PACK
ORAL_TABLET | ORAL | 0 refills | Status: AC
Start: 2022-03-20 — End: 2022-03-25

## 2022-03-20 NOTE — Patient Instructions (Signed)
If you are experiencing COVID-19 symptoms:    Fever 100 degrees or greater  New loss of taste or smell  Shortness of breath  Muscle pain  Cough  Chills    Please first contact your pharmacy for pickup instructions to avoid spreading COVID-19.    FACT SHEET FOR PATIENTS, PARENTS, AND CAREGIVERS   EMERGENCY USE AUTHORIZATION (EUA) OF PAXLOVID   FOR CORONAVIRUS DISEASE 2019 (COVID-19)     You are being given this Fact Sheet because your healthcare provider believes it is necessary to provide you with PAXLOVID for the treatment of mild-to-moderate coronavirus disease (COVID-19) caused by the SARS-CoV-2 virus. This Fact Sheet contains information to help you understand the risks and benefits of taking the PAXLOVID you may receive. This Fact Sheet also contains information about how to take PAXLOVID and how to report side effects or problems with the appearance or packaging of PAXLOVID.     The U.S. Food and Drug Administration (FDA) has issued an Emergency Use Authorization (EUA) to make PAXLOVID available for the treatment of mild-to-moderate COVID-19 in adults and children 61 years of age and older weighing at least 61 pounds (40 kg) who are at high risk for progression to severe COVID-19, including hospitalization or death (for more details about an EUA please see "What is an Emergency Use Authorization?" at the end of this document). Read this Fact Sheet for information about PAXLOVID. Talk to your healthcare provider about your options or if you have any questions. It is your choice to take PAXLOVID.     What is COVID-19?     COVID-19 is caused by a virus called a coronavirus. You can get COVID-19 through close contact with another person who has the virus.     COVID-19 illnesses have ranged from very mild-to-severe, including illness resulting in death. While information so far suggests that most COVID-19 illness is mild, serious illness can happen and may cause some of your other medical conditions to become  worse. Older people and people of all ages with severe, long lasting (chronic) medical conditions like heart disease, lung disease, and diabetes, for example seem to be at higher risk of being hospitalized for COVID-19.       What is PAXLOVID?     PAXLOVID is a medicine that is available under EUA for the treatment of mild-to-moderate COVID-19 in adults and children 57 years of age and older weighing at least 31 pounds (40 kg) who are at high risk for progression to severe COVID-19, including hospitalization or death. Although PAXLOVID is FDA-approved for the treatment of YNWGN-56 in certain adults (see section What other treatment choices are there?), PAXLOVID use in children remains investigational because it is still being studied. There is limited information about the safety and effectiveness of using PAXLOVID to treat children with mild-to-moderate COVID-19.      What is the most important information I should know about PAXLOVID?     PAXLOVID can interact with other medicines causing severe or life-threatening side effects or death. It is important to know the medicines that should not be taken with PAXLOVID.     Do not take PAXLOVID if:     You are taking any of the following medicines:   alfuzosin   amiodarone   apalutamide   carbamazepine   colchicine   dihydroergotamine   dronedarone   eletriptan   eplerenone   ergotamine   finerenone   flecainide   flibanserin   ivabradine   lomitapide  lovastatin   lumacaftor/ivacaftor   lurasidone   methylergonovine   midazolam (oral)   naloxegol   phenobarbital   phenytoin   pimozide   primidone   propafenone   quinidine   ranolazine   rifampin   rifapentine   St. John's Wort (hypericum perforatum)   sildenafil (Revatio) for pulmonary arterial hypertension   silodosin   simvastatin   tolvaptan   triazolam   ubrogepant   voclosporin     These are not the only medicines that may cause serious or life-threatening side effects if taken with PAXLOVID. PAXLOVID may  increase or decrease the levels of multiple other medicines. It is very important to tell your healthcare provider about all of the medicines you are taking because additional laboratory tests or changes in the dose of your other medicines may be necessary during treatment with PAXLOVID. Your healthcare provider may also tell you about specific symptoms to watch out for that may indicate that you need to stop or decrease the dose of some of your other medicines.        you are allergic to nirmatrelvir, ritonavir, or any of the ingredients in PAXLOVID.  See the end of this leaflet for a complete list of ingredients in PAXLOVID. See  "What are the important possible side effects of PAXLOVID?" for signs and  symptoms of allergic reactions.         What should I tell my healthcare provider before I take PAXLOVID?   Tell your healthcare provider if you:     have kidney problems. You may need a different dose of PAXLOVID.   have liver problems, including hepatitis.   have Human Immunodeficiency Virus 1 (HIV-1) infection. PAXLOVID may lead to some HIV-1 medicines not working as well in the future.   are pregnant or plan to become pregnant. It is not known if PAXLOVID can harm your unborn baby. Tell your healthcare provider right away if you are or if you become pregnant.   are breastfeeding or plan to breastfeed. It is not known if PAXLOVID can pass into your breast milk. Talk to your healthcare provider about the best way to feed your baby during treatment with PAXLOVID.     Some medicines may interact with PAXLOVID and may cause serious side effects.    Tell your healthcare provider about all the medicines you take, including prescription and over-the-counter medicines, vitamins, and herbal supplements.     Your healthcare provider can tell you if it is safe to take PAXLOVID with other medicines.     You can ask your healthcare provider or pharmacist for a list of medicines that interact with PAXLOVID.     Do not start  taking a new medicine without telling your healthcare provider.     Tell your healthcare provider if you are taking combined birth control (hormonal contraceptive). PAXLOVID may affect how your hormonal contraceptives work. Females who are able to become pregnant should use another effective alternative form of contraception or an additional barrier method of contraception during treatment with PAXLOVID. Talk to your healthcare provider if you have any questions about contraceptive methods that might be right for you.     How do I take PAXLOVID?     Take PAXLOVID exactly as your healthcare provider tells you to take it.         PAXLOVID consists of 2 medicines: nirmatrelvir tablets and ritonavir tablets. The 2 medicines are taken together 2 times each day for 5 days.     Nirmatrelvir is an oval, pink tablet.   Ritonavir is a white or off-white tablet.      PAXLOVID is available in 2 Dose Packs (see Figures A and B below). Your healthcare provider will prescribe the PAXLOVID Dose Pack that is right for you.    If you have kidney disease, your healthcare provider may prescribe a lower dose (see Figure B). Talk to your healthcare provider to make sure you receive the correct Dose Pack.                   Do not remove your PAXLOVID tablets from the blister card before you are ready to take your dose.   Take your first dose of PAXLOVID in the morning or evening, depending on when you pick up your prescription, or as your healthcare provider tells you to.   Swallow the tablets whole. Do not chew, break, or crush the tablets.   Take PAXLOVID with or without food.   Do not stop taking PAXLOVID without talking to your healthcare provider, even if you feel better.   If you miss a dose of PAXLOVID within 8 hours of the time it is usually taken, take it as soon as you remember. If you miss a dose by more than 8 hours, skip the missed dose and take the next dose at your regular time. Do not take 2 doses of PAXLOVID at the same  time.   If you take too much PAXLOVID, call your healthcare provider or go to the nearest hospital emergency room right away.   If you are taking a ritonavir- or cobicistat-containing medicine to treat hepatitis C or HIV-1 infection, you should continue to take your medicine as prescribed by your healthcare provider.     Talk to your healthcare provider if you do not feel better or if you feel worse after 5 days.       What are the important possible side effects of PAXLOVID?     PAXLOVID may cause serious side effects, including:     Allergic reactions, including severe allergic reactions (anaphylaxis) have  happened during treatment with PAXLOVID. Stop taking PAXLOVID and get medical help right away if you get any of the following symptoms of an allergic reaction:     skin rash, hives, blisters or peeling skin   painful sores or ulcers in the mouth, nose, throat or genital area   swelling of the mouth, lips, tongue or face       trouble swallowing  or breathing    throat tightness    hoarseness          Liver Problems. Tell your healthcare provider right away if you get any of the following signs and symptoms of liver problems during treatment with PAXLOVID:     loss of appetite   yellowing of your skin and the white of eyes   dark-colored urine      pale colored stools   itchy skin   stomach-area (abdominal) pain        The most common side effects of PAXLOVID include: altered sense of taste and diarrhea.     Other possible side effects include:    headache    vomiting    abdominal pain    nausea    high blood pressure    feeling generally unwell     These are not all the possible side effects of PAXLOVID. For more information, ask your healthcare provider or pharmacist.  What other treatment choices are there?     PAXLOVID is FDA-approved for the treatment of mild-to-moderate QHUTM-54 in certain adults; however, there are not sufficient quantities of the approved presentations (i.e., dose packs) of PAXLOVID  at this time. This EUA continues to authorize the emergency use of PAXLOVID for the approved patient population to ensure continued access in order to meet the public health need.     VEKLURY (remdesivir) is FDA-approved for the treatment of mild-to-moderate YTKPT-46 in certain adults and children. Talk with your healthcare provider to see if [facility] is appropriate for you.     For information on the emergency use of other medicines that are authorized by FDA to treat people with COVID-19, please go to CareerCue.tn. Your healthcare provider may talk with you about clinical trials for which you may be eligible.     It is your choice to be treated or not to be treated with PAXLOVID. Should you decide not to receive it or for your child not to receive it, it will not change your standard medical care.     What if I am pregnant or breastfeeding?     There is limited experience treating pregnant women or breastfeeding mothers with PAXLOVID. For a mother and unborn baby, the benefit of taking PAXLOVID may be greater than the risk from the treatment. If you are pregnant, discuss your options and specific situation with your healthcare provider.     If you are breastfeeding, discuss your options and specific situation with your healthcare provider.     How do I report side effects or problems with the appearance or packaging of PAXLOVID?     Contact your healthcare provider if you have any side effects that bother you or do not go away.     Report side effects or problems with the appearance or packaging of PAXLOVID (see Figures A and B above for examples of PAXLOVID Dose Packs) to FDA MedWatch at SmoothHits.hu or call 1-800-FDA-1088 or you can report side effects to [employer]. at the contact information provided below.     Website  Fax number Telephone number   www.pfizersafetyreporting.com  [phone]       How should I store Taylor?    Store PAXLOVID tablets at room temperature, between 68?F to 77?F (20?C to 25?C).  Keep PAXLOVID and all medicines out of the reach of children.     What if I have questions about the expiration date for my PAXLOVID?     The FDA has extended the expiration date (shelf-life) for some lots of PAXLOVID. To find the extended expiration date, enter the lot number found on the side of carton or bottom of blister pack at this website: https://www.paxlovidlotexpiry.com/ or talk with your healthcare provider. Information on the authorized shelf-life extensions for PAXLOVID may also be found at OlderSong.se.     How can I learn more about COVID-19?    Ask your healthcare provider.  Visit [email or web address]  Contact your local or state public health department.    What is an Emergency Use Authorization (EUA)?    The [place] FDA has made PAXLOVID available under an emergency access  mechanism called an Emergency Use Authorization (EUA). The EUA is supported by a  Education officer, museum and Human Service (HHS) declaration that circumstances exist to  justify the emergency use of drugs and biological products during the COVID-19  Pandemic.    In issuing an EUA, the FDA has determined, among other things, that  based on the total amount of scientific evidence available including data from adequate and well-controlled clinical trials, if available, it is reasonable to believe that the product may be effective for diagnosing, treating, or preventing COVID-19, or a serious or life-threatening disease or condition caused by COVID-19; that the known and potential benefits of the product, when used to diagnose, treat, or prevent such disease or condition, outweigh the known and potential risks of such product; and that there are no adequate,  approved, and available alternatives.       All of these criteria must be met to allow for the product to be available under an EUA. The EUA for PAXLOVID is in effect for the duration of the COVID-19 declaration justifying emergency use of this product, unless the relevant EUA declaration is terminated or the EUA revoked (after which the products may no longer be used under the EUA).     What are the ingredients in PAXLOVID?     Active ingredient: nirmatrelvir and ritonavir   Nirmatrelvir inactive ingredients: colloidal silicon dioxide, croscarmellose sodium, lactose monohydrate, microcrystalline cellulose, and sodium stearyl fumarate. Film-coating contains: hydroxy propyl methylcellulose, iron oxide red, polyethylene glycol, and titanium dioxide.   Ritonavir inactive ingredients: anhydrous dibasic calcium phosphate, colloidal silicon dioxide, copovidone, sodium stearyl fumarate, and sorbitan monolaurate. The film coating may contain: colloidal anhydrous silica, colloidal silicon dioxide, hydroxypropyl cellulose, hypromellose, polyethylene glycol, polysorbate 80, talc, and titanium dioxide.     Additional Information    For general questions, visit the website or call the telephone number provided below.    Website Telephone number   [email or web address]     [phone]  (1-877-C19-PACK)          [id number]  Revised: 12/2021

## 2022-03-20 NOTE — Progress Notes (Signed)
[facility] PROVIDER NOTE   Date of Service: 03/20/2022 Patient's PCP: Oda Cogan   Note Started: 03/20/2022 10:33 AM DOB: 02/19/1961           SUBJECTIVE:  The history provided by the patient.  Interpreter used: No    HPI: 61yrold male, who has past medical history below, presents with COVID since Wednesday. Cough and runny nose. No chest pain or dyspnea. Subjective fevers only. Interested in [facility]    Past Medical History:   Diagnosis Date    Insomnia, unspecified 10/18/2006    Other diseases of nasal cavity and sinuses(478.19) 10/18/2006    Pain in limb 10/18/2006    right foot    Varicella without mention of complication        OBJECTIVE:    Constitutional:       General: Patient is not in acute distress.     Appearance: Normal appearance. Patient is not ill-appearing or toxic-appearing.   Pulmonary:      Effort: No respiratory distress.   Neurological:      Mental Status: Patient is alert.   Psychiatric:         Mood and Affect: Mood normal.         Behavior: Behavior normal.      ASSESSMENT AND PLAN:  64yrale with COVID and eligible for paxlovid. He will hold cardura for the next 8 days. Strict ED precautions provided.    Diagnoses and all orders for this visit:  COVID  Other orders  -     Nirmatrelvir-Ritonavir Tablet Dose Pack Tablet; TAKE 2 TABLETS OF NIRMATRELVIR 150 MG (300 MG) AND 1 TABLET OF RITONAVIR 100 MG BY MOUTH EVERY 12 HOURS FOR 5 DAYS      Informed they can call back for further care. Care precautions discussed and all questions answered.     Video Visit Note   I performed this clinical encounter by utilizing a real-time telehealth video connection between my location and the patient's location. The patient's location was confirmed during this visit. I obtained verbal consent from the patient to perform this clinical encounter utilizing video and prepared the patient by answering any questions they had about the telehealth interaction.    I spent a total of 25 minutes today on this patient's  visit.      Electronically signed:  DaJeralyn RuthsMD - Attending Physician  Ballinger Plena

## 2022-04-23 ENCOUNTER — Other Ambulatory Visit: Payer: Self-pay | Admitting: Family Medicine

## 2022-04-23 ENCOUNTER — Ambulatory Visit: Payer: 59 | Attending: Family Medicine

## 2022-04-23 DIAGNOSIS — I10 Essential (primary) hypertension: Secondary | ICD-10-CM | POA: Insufficient documentation

## 2022-04-23 DIAGNOSIS — Z125 Encounter for screening for malignant neoplasm of prostate: Secondary | ICD-10-CM | POA: Insufficient documentation

## 2022-04-23 DIAGNOSIS — E785 Hyperlipidemia, unspecified: Secondary | ICD-10-CM | POA: Insufficient documentation

## 2022-04-23 LAB — COMPREHENSIVE METABOLIC PANEL
Alanine Transferase (ALT): 15 U/L (ref <=41)
Albumin: 4.2 g/dL (ref 4.0–4.9)
Alkaline Phosphatase (ALP): 79 U/L (ref 35–129)
Anion Gap: 13 mmol/L (ref 7–15)
Aspartate Transaminase (AST): 21 U/L (ref <=41)
Bilirubin Total: 0.7 mg/dL (ref <=1.2)
Calcium: 9.2 mg/dL (ref 8.6–10.0)
Carbon Dioxide Total: 23 mmol/L (ref 22–29)
Chloride: 102 mmol/L (ref 98–107)
Creatinine Serum: 0.86 mg/dL (ref 0.51–1.17)
E-GFR Creatinine (Male): 99 mL/min/{1.73_m2}
Glucose: 90 mg/dL (ref 74–109)
Potassium: 3.9 mmol/L (ref 3.4–5.1)
Protein: 6.8 g/dL (ref 6.6–8.7)
Sodium: 138 mmol/L (ref 136–145)
Urea Nitrogen, Blood (BUN): 9 mg/dL (ref 6–20)

## 2022-04-23 LAB — LIPID PANEL WITH DLDL REFLEX
Cholesterol: 210 mg/dL — ABNORMAL HIGH (ref <200)
HDL Cholesterol: 56 mg/dL (ref >40)
LDL Cholesterol Calculation: 126 mg/dL — ABNORMAL HIGH (ref <100)
Non-HDL Cholesterol: 154 mg/dL — ABNORMAL HIGH (ref <=150)
Total Cholesterol: HDL Ratio: 3.8 (ref <4.0)
Triglyceride: 139 mg/dL (ref <150)

## 2022-04-23 LAB — PSA SCREEN: PSA Screen: 0.7 ng/mL (ref <=4.0)

## 2022-04-23 MED ORDER — AMLODIPINE 5 MG TABLET
5.0000 mg | ORAL_TABLET | Freq: Every day | ORAL | 3 refills | Status: DC
Start: 2022-04-23 — End: 2023-04-04

## 2022-04-23 MED ORDER — DOXAZOSIN 2 MG TABLET
2.0000 mg | ORAL_TABLET | Freq: Every day | ORAL | 3 refills | Status: DC
Start: 2022-04-23 — End: 2023-04-04

## 2022-04-23 NOTE — Telephone Encounter (Signed)
Recent Visits  Date Type Provider Dept   03/20/22 Telemedicine Scheduled Donne Anon, MD [facility]   01/07/22 Office Visit Oda Cogan, MD [facility]   10/31/20 Office Visit Gennaro Africa, MD [facility]   Showing recent visits within past 540 days with a meds authorizing provider and meeting all other requirements  Future Appointments  No visits were found meeting these conditions.  Showing future appointments within next 150 days with a meds authorizing provider and meeting all other requirements

## 2022-04-23 NOTE — Telephone Encounter (Signed)
Requested Prescriptions     Pending Prescriptions Disp Refills    Amlodipine (NORVASC) 5 mg Tablet 90 tablet 3     Sig: Take 1 tablet by mouth every day. (blood pressure)    Doxazosin (CARDURA) 2 mg Tablet       Sig:        Megan Zertuche  MOSCII

## 2023-03-18 ENCOUNTER — Encounter: Payer: Self-pay | Admitting: Family Medicine

## 2023-03-18 ENCOUNTER — Ambulatory Visit: Payer: 59 | Admitting: Family Medicine

## 2023-03-18 ENCOUNTER — Ambulatory Visit: Payer: 59 | Attending: Family Medicine

## 2023-03-18 VITALS — BP 127/79 | HR 91 | Temp 97.3°F | Ht 68.0 in | Wt 201.5 lb

## 2023-03-18 DIAGNOSIS — R5383 Other fatigue: Secondary | ICD-10-CM

## 2023-03-18 DIAGNOSIS — R0609 Other forms of dyspnea: Secondary | ICD-10-CM

## 2023-03-18 DIAGNOSIS — R739 Hyperglycemia, unspecified: Secondary | ICD-10-CM | POA: Insufficient documentation

## 2023-03-18 DIAGNOSIS — Z1211 Encounter for screening for malignant neoplasm of colon: Secondary | ICD-10-CM

## 2023-03-18 DIAGNOSIS — Z125 Encounter for screening for malignant neoplasm of prostate: Secondary | ICD-10-CM

## 2023-03-18 DIAGNOSIS — E785 Hyperlipidemia, unspecified: Secondary | ICD-10-CM | POA: Insufficient documentation

## 2023-03-18 DIAGNOSIS — Z0000 Encounter for general adult medical examination without abnormal findings: Secondary | ICD-10-CM

## 2023-03-18 LAB — COMPREHENSIVE METABOLIC PANEL
Alanine Transferase (ALT): 24 U/L (ref <=41)
Albumin: 4.3 g/dL (ref 4.0–4.9)
Alkaline Phosphatase (ALP): 79 U/L (ref 35–129)
Anion Gap: 11 mmol/L (ref 7–15)
Aspartate Transaminase (AST): 21 U/L (ref <=41)
Bilirubin Total: 0.5 mg/dL (ref <=1.2)
Calcium: 9.1 mg/dL (ref 8.6–10.0)
Carbon Dioxide Total: 24 mmol/L (ref 22–29)
Chloride: 100 mmol/L (ref 98–107)
Creatinine Serum: 0.86 mg/dL (ref 0.51–1.17)
E-GFR Creatinine (Male): 99 mL/min/{1.73_m2}
Glucose: 96 mg/dL (ref 74–109)
Potassium: 4 mmol/L (ref 3.4–5.1)
Protein: 7.3 g/dL (ref 6.6–8.7)
Sodium: 135 mmol/L — ABNORMAL LOW (ref 136–145)
Urea Nitrogen, Blood (BUN): 9 mg/dL (ref 6–20)

## 2023-03-18 LAB — CBC WITH DIFFERENTIAL
Basophils % Auto: 0.6 %
Basophils Abs Auto: 0 10*3/uL (ref 0.0–0.2)
Eosinophils % Auto: 3.3 %
Eosinophils Abs Auto: 0.1 10*3/uL (ref 0.0–0.5)
Hematocrit: 42.6 % (ref 41.0–53.0)
Hemoglobin: 14.4 g/dL (ref 13.5–17.5)
Lymphocytes % Auto: 27.9 %
Lymphocytes Abs Auto: 1.3 10*3/uL (ref 1.0–4.8)
MCH: 31.9 pg (ref 27.0–33.0)
MCHC: 33.9 % (ref 32.0–36.0)
MCV: 94.2 fL (ref 80.0–100.0)
MPV: 7.8 fL (ref 6.8–10.0)
Monocytes % Auto: 10.3 %
Monocytes Abs Auto: 0.5 10*3/uL (ref 0.1–0.8)
Neutrophils % Auto: 57.9 %
Neutrophils Abs Auto: 2.6 10*3/uL (ref 1.8–7.7)
Platelet Count: 214 10*3/uL (ref 130–400)
RDW: 13.6 % (ref 0.0–14.7)
Red Blood Cell Count: 4.52 10*6/uL (ref 4.50–5.90)
White Blood Cell Count: 4.5 10*3/uL (ref 4.5–11.0)

## 2023-03-18 LAB — LIPID PANEL WITH DLDL REFLEX
Cholesterol: 236 mg/dL — ABNORMAL HIGH (ref <200)
HDL Cholesterol: 73 mg/dL (ref >40)
LDL Cholesterol Calculation: 137 mg/dL — ABNORMAL HIGH (ref <100)
Non-HDL Cholesterol: 163 mg/dL — ABNORMAL HIGH (ref <=150)
Total Cholesterol: HDL Ratio: 3.2 (ref <4.0)
Triglyceride Level: 130 mg/dL (ref <150)

## 2023-03-18 LAB — HEMOGLOBIN A1C
Hgb A1C,Glucose Est Avg: 108 mg/dL
Hgb A1C: 5.4 % (ref 3.9–5.6)

## 2023-03-18 LAB — TSH WITH FREE T4 REFLEX: Thyroid Stimulating Hormone: 1.56 u[IU]/mL (ref 0.27–4.20)

## 2023-03-18 LAB — PSA SCREEN: PSA Screen: 0.8 ng/mL (ref <=4.0)

## 2023-03-18 NOTE — Nursing Note (Addendum)
Patient vitals taken, screened for pain and pharmacy confirmed.     EKG done per Dr. Alan's order.       Dustin Kope, MA

## 2023-03-18 NOTE — Progress Notes (Signed)
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  He is struggling to use device .  HE had a home study last February   Past Medical History:   Diagnosis Date    Insomnia, unspecified 10/18/2006    Other diseases of nasal cavity and sinuses(478.19) 10/18/2006    Pain in limb 10/18/2006    right foot    Varicella without mention of complication      Past Surgical History:   Procedure Laterality Date    PR APPENDECTOMY  1968       Current Outpatient Medications on File Prior to Visit   Medication Sig Dispense Refill    Amlodipine (NORVASC) 5 mg Tablet Take 1 tablet by mouth every day. (blood pressure) 90 tablet 3    Cholecalciferol (VITAMIN D) 1,000 unit (25 mcg) Tablet Take 1,000 Units by mouth every day.      Doxazosin (CARDURA) 2 mg Tablet Take 1 tablet by mouth every day at bedtime. 90 tablet 3    Fluticasone (FLONASE) 50 mcg/actuation nasal spray Instill 1 spray into EACH nostril every day. 48 g 3     No current facility-administered medications on file prior to visit.         Pcn (Penicillin) [Penicillins]    Hives    Comment:Given shot for reaction.    Review of Systems -   ENT:   negative , but does use Flonase for SAR prn           Eye:  negative  Neck: no pain, no swollen glands, normal ROM  Lungs: some SOB with activity .  CV: No chest pain   GI: recent tarry stools for last year  GU: negative  Musculoskelatal: negative  Neuro: migraine headaches  SKin: negative  Psych: sleep disturbance    .    Lifestyle Vital Signs:    Sleep ( < 7 hours , > 9 )    6-7      Tobacco   N     Alcohol  ( > 14 / week , or > 3 / day )      8 total a week      Emotional well- being , PHQ2 0/6       Diet: ( 5 fruits and Vegetables / day )    Fruit: 2-3    Veg 2-3     Exercise 150 minutes / week     Swims 4 days a week for 15 to 20 minutes      BMI  < 25 , > 18    30      Stress Level     Mild        Answers submitted by the patient for this visit:  Patient Health Questionnaire (Submitted on 03/17/2023)  PHQ-2 Score:: 0    Blood pressure 127/79, pulse 91, temperature 36.3 C (97.3 F), temperature source Temporal, height 1.727 m (5\' 8" ), weight 91.4 kg (201 lb 8 oz), SpO2 96%.    General Appearance: cooperative.  Eyes:  conjunctivae and sclera normal.  Ears:  left canal normal, right canal ceruminous, and TMs: TM not visualized on right secondary to cerumen.  Nose:  no sinus tenderness.  Mouth: normal.  Neck:  Neck supple. No  adenopathy, thyroid symmetric, normal size.  Heart:  normal rate and regular rhythm, no murmurs, clicks, or gallops.  Lungs: clear to auscultation.  Abdomen: symmetric, no masses palpable, no organomegaly, bowel sounds normal, no bruits heard, liver span normal to percussion, soft, non-tender, spleen non-palpable, obese.  Extremities:  no cyanosis, clubbing, or edema.  Neuro: speech normal, mental status intact, muscle tone normal, muscle strength normal.  Mental Status: Appearance/Cooperation: well developed and well nourished, non-toxic, in no respiratory distress and acyanotic, alert, and well groomed and dressed  Attitude: pleasant   Behavior :normal  Eye Contact: normal  Attention Span: good  Speech: normal volume, rate, and pitch  Affect: full and appropriate  Musculoskeletal: back:negative findings: symmetric, no tenderness to percussion or palpation      (Z00.00) Routine general medical examination at a health care facility  (primary encounter diagnosis)  Comment:   Plan: follow up yearly     (Z12.11) Screen for colon cancer  Comment:   Plan: GASTROENTEROLOGY REFERRAL            (E78.5) Hyperlipidemia with target LDL less than 100  Comment:   Plan: Lipid Panel with DLDL Reflex, Comprehensive         Metabolic Panel            (Z12.5) Screening PSA (prostate specific antigen)  Comment:   Plan: PSA Screen            (R53.83) Fatigue, unspecified type  Comment:   Plan: CBC with  Differential, Comprehensive Metabolic         Panel, TSH with Free T4 Reflex       Do labs then schedule follow up for lifestyle management.    (R73.9) Hyperglycemia  Comment:   Plan: Hemoglobin A1C                (R06.09) DOE (dyspnea on exertion)  Comment: SR with LVH  Plan: EXERCISE TREADMILL TEST, Electrocardiogram with        rhythm strip                  I did review patient's past medical and family/social history, no changes noted.    Barriers to Learning assessed: none. Patient verbalizes understanding of teaching and instructions.     Electronically signed by   Brantley Stage MD    Associate Physician

## 2023-03-19 LAB — ELECTROCARDIOGRAM WITH RHYTHM STRIP: QTC: 438

## 2023-03-30 ENCOUNTER — Other Ambulatory Visit: Payer: Self-pay | Admitting: Family Medicine

## 2023-03-30 DIAGNOSIS — I10 Essential (primary) hypertension: Secondary | ICD-10-CM

## 2023-04-04 NOTE — Telephone Encounter (Addendum)
Pharmacy Refill Optimization (PRO)    Refill request has been pended to the pharmacist for review per PRO P&T Approved Protocol 04/04/2023     Meets PRO P&T Approved Protocol: YES    ====================================================================    Amlodipine  Medication name: AMLODIPINE  Labs (if required by protocol): N/A    Doxazosin  Medication name: Doxazosin  Labs (if required by protocol): N/A

## 2023-04-04 NOTE — Telephone Encounter (Signed)
Refill authorized per P&T Protocol 04/04/2023    Plan was reviewed with the Pharmacy Resident. I agree with the Pharmacy Resident's plan of care and recommendations.    Howell Rucks, PharmD   Pharmacy Refill Optimization (PRO)

## 2023-06-02 ENCOUNTER — Encounter: Payer: Self-pay | Admitting: Family Medicine

## 2023-06-02 DIAGNOSIS — L989 Disorder of the skin and subcutaneous tissue, unspecified: Secondary | ICD-10-CM

## 2023-06-02 NOTE — Telephone Encounter (Signed)
Appointment recommended

## 2023-06-02 NOTE — Telephone Encounter (Signed)
General Advice / Message to MD:    PT is calling in wanting to know if PCP can take a look at his photo that he sent below and would be able to make a derm referral off of the photo. If not Pt wants to know if this can be done through a VV or does he need to come in the office. Let PT know that usually with a skin check it would need to be in office and a appt is usually necessary for referrals. Pt still wanted me to send a message to PCP to see what he says.     Please advise.     Joneen Roach  PSR2  Patient Contact Center

## 2023-06-03 NOTE — Telephone Encounter (Addendum)
Please advise 

## 2023-07-01 NOTE — Progress Notes (Signed)
Visit Date: 07/08/2023, last seen on 03/09/2012 by Dr. 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 Present months. No bleeding, itch. No prior treatment.   PE: hyperkeratotic papule with filiform surface on the right jawline  AP: The viral and infectious etiology of the condition was explained to the patient.  - After obtaining verbal consent, 1 lesion(s) were treated with liquid nitrogen with a 10 second thaw time x2. The risks, including pain, blister formation, infection, hypo/hyperpigmentation, scar, recurrence, and the need for further treatment were discussed with the patient and informed consent was obtained verbally. The patient tolerated the treatment well and knows to return if the lesions do not resolve.       Actinic Keratosis:   HX: Scaly red papules noted incidentally at visit.  Not noted by the patient.   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   No treatment to date.   Irregular SPF  PE: Scaly red papules across the scalp  AP: Discussed pre-cancerous nature and chronicity of condition. Increased risk of malignancy if left untreated.  - Given the diffuse nature of actinic keratosis in combination with patient preferences we recommended field treatment with Efudex. Patient opt with continued observation.  - To consider Efudex at next visit  - Reviewed ABCDEs, photo protection, monthly self check      Seborrheic Keratoses  HX: Patient reports lesion on the back, presents for at least one year, slowly growing. Endorses Asymptomatic. No prior treatment.   PE: Stuck on brown papule(s) with cobblestone surface on the mid lower back  AP: Reassurance was  given as to the benignity of the growths. No treatment medically necessary at this time. Offered cosmetic removal, informed patient of the out of pocket cost it may incur. Patient declines at this time.    Scar. History of melanoma  HX: Site is well healed and asymptomatic. 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   PE: Well healed scar without evidence of recurrence.   - No pre or post auricular, sub mental, cervical, supraclavicular, axillary, inguinal, or popliteal LAD  AP: The nature of sun-induced photo-aging and skin cancers is discussed.  Sun avoidance, protective clothing, and the use of 30-SPF sunscreens is advised. Observe closely for skin damage/changes, and call if such occurs.    Health Maintenance  - Reviewed photo protection using sunscreen SPF 30 or higher qday to exposed areas, reapply every 1.5 hrs when outdoors, wearing big broad-brimmed hats, photo-protective UPF embedded clothing, monthly self check of the skin. Tanning response is direct result of DNA damage.   - Moles that are uniformly pigmented, symmetrical, and have sharp borders usually are benign. If patient notices any spot that is dissimilar from others, monthly growing in size, changing or symptomatic, RTC for re-evaluation    Patient Fitzpatrick's Skin Type: 2     Return Visit: 1 month      Family History: There is not a family history of melanoma skin cancer.    Social History:  The patient does not smoke or use tobacco products.  Occupation: Higher education careers adviser    Review of Systems:   The patient has been asked about current:  fever, fatigue, weight loss, visual changes, wheezing, cough, hay fever/pollen allergy, headaches, sore throat, bleeding gums, joint or muscle pain, shortness of breath, chest pain, lymph node  swelling, infections, pains in calves, nausea, vomiting, diarrhea, burning with urination, depression/anxiety, numbness/tingling, or dizziness.  Positives are: none. All ROS are negative.     Meds:  List in EMR viewed today    Allergies:    List in EMR viewed today    Labs Reviewed:  -    Records Reviewed:   None    Areas Examined:  full body      SCRIBE DISCLAIMER:  I, Solon Palm, a trained medical scribe, scribed this noted in the presence of Dr. Daphene Calamity, MD.   Electronically signed by Lynford Humphrey, 07/08/2023  8:25 AM        SCRIBE DISCLAIMER  I, Daphene Calamity, MD, personally performed the services described in this documentation, as scribed by the trained medical scribe above in my presence, and it is both accurate and complete.     Electronically signed by: Daphene Calamity, MD -  07/08/2023  9:22 AM

## 2023-07-08 ENCOUNTER — Ambulatory Visit: Payer: 59 | Admitting: Dermatology

## 2023-07-08 DIAGNOSIS — L570 Actinic keratosis: Secondary | ICD-10-CM

## 2023-07-08 DIAGNOSIS — L821 Other seborrheic keratosis: Secondary | ICD-10-CM

## 2023-07-08 DIAGNOSIS — Z85820 Personal history of malignant melanoma of skin: Secondary | ICD-10-CM

## 2023-07-08 DIAGNOSIS — L905 Scar conditions and fibrosis of skin: Secondary | ICD-10-CM

## 2023-07-08 DIAGNOSIS — Z86006 Personal history of melanoma in-situ: Secondary | ICD-10-CM

## 2023-07-08 DIAGNOSIS — B079 Viral wart, unspecified: Secondary | ICD-10-CM

## 2023-07-08 NOTE — Nursing Note (Signed)
Identified patient by name and DOB, screened for pain, mobility assessment, reviewed allergies and verified pharmacy. Vitals not taken per provider's request.    Has pt fallen in the last 6 months: No  Has pt used assistive devices such as a walker, wheelchair, or cane: No    I verified with patient;   It is okay to leave a detailed message regarding confidential medical information   on [phone] (M)  Or via Mychart.    Lisabeth Devoid, LVN

## 2023-08-05 ENCOUNTER — Ambulatory Visit: Payer: 59 | Admitting: Dermatology

## 2023-08-05 ENCOUNTER — Other Ambulatory Visit: Payer: Self-pay

## 2023-08-05 DIAGNOSIS — Z872 Personal history of diseases of the skin and subcutaneous tissue: Secondary | ICD-10-CM

## 2023-08-05 DIAGNOSIS — B078 Other viral warts: Secondary | ICD-10-CM

## 2023-08-05 DIAGNOSIS — L570 Actinic keratosis: Secondary | ICD-10-CM

## 2023-08-05 MED ORDER — FLUOROURACIL 5 % TOPICAL CREAM
TOPICAL_CREAM | TOPICAL | 0 refills | Status: DC
Start: 2023-08-05 — End: 2024-05-23

## 2023-08-05 NOTE — Nursing Note (Signed)
 Identified patient by name and DOB, screened for pain, mobility assessment, reviewed allergies and verified pharmacy.   Vitals not taken per provider's request.     Has patient fallen in the last 6 months: No  Does the patient used assistive devices such as a walker, wheelchair, or cane: No     I verified with patient;  It is okay to leave a detailed message regarding confidential medical information on phone number [phone] (M).  The patient would like to be notified via MyChart as well.     I notified the patient that if the provider will be performing an exam under their undergarments, a chaperone will be offered during that exam.  The patient has declined a chaperone.  The provider was notified.     Ananias Balls, LVN

## 2023-08-05 NOTE — Progress Notes (Deleted)
 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 Present months. No bleeding, itch. No prior treatment.   PE: hyperkeratotic papule with filiform surface on the right jawline  AP: The viral and infectious etiology of the condition was explained to the patient.  - After obtaining verbal consent, 1 lesion(s) were treated with liquid nitrogen with a 10 second thaw time x2. The risks, including pain, blister formation, infection, hypo/hyperpigmentation, scar, recurrence, and the need for further treatment were discussed with the patient and informed consent was obtained verbally. The patient tolerated the treatment well and knows to return if the lesions do not resolve.       Actinic Keratosis:   HX: Scaly red papules noted incidentally at visit.  Not noted by the patient.   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   No treatment to date.   Irregular SPF  PE: Scaly red papules across the scalp  AP: Discussed pre-cancerous nature and chronicity of condition. Increased risk of malignancy if left untreated.  - Given the diffuse nature of actinic keratosis in combination with patient preferences we recommended field treatment with Efudex. Patient opt with continued observation.  - To consider Efudex at next visit  - Reviewed ABCDEs, photo protection, monthly self check      Seborrheic Keratoses  HX: Patient reports lesion on the back, presents for at least one year, slowly growing. Endorses Asymptomatic. No prior treatment.   PE: Stuck on brown papule(s) with cobblestone surface on the mid lower back  AP: Reassurance  was given as to the benignity of the growths. No treatment medically necessary at this time. Offered cosmetic removal, informed patient of the out of pocket cost it may incur. Patient declines at this time.    Scar. History of melanoma  HX: Site is well healed and asymptomatic. 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   PE: Well healed scar without evidence of recurrence.   - No pre or post auricular, sub mental, cervical, supraclavicular, axillary, inguinal, or popliteal LAD  AP: The nature of sun-induced photo-aging and skin cancers is discussed.  Sun avoidance, protective clothing, and the use of 30-SPF sunscreens is advised. Observe closely for skin damage/changes, and call if such occurs.    Health Maintenance  - Reviewed photo protection using sunscreen SPF 30 or higher qday to exposed areas, reapply every 1.5 hrs when outdoors, wearing big broad-brimmed hats, photo-protective UPF embedded clothing, monthly self check of the skin. Tanning response is direct result of DNA damage.   - Moles that are uniformly pigmented, symmetrical, and have sharp borders usually are benign. If patient notices any spot that is dissimilar from others, monthly growing in size, changing or symptomatic, RTC for re-evaluation    Patient Fitzpatrick's Skin Type: 2     Return Visit: 1 month ***      Family History: There is not a family history of melanoma skin cancer.  Social History: The patient does not smoke or use tobacco products.  Occupation: Higher education careers adviser    Review of Systems:   The patient has been asked about current:  fever, fatigue, weight loss, visual changes, wheezing, cough, hay fever/pollen allergy, headaches, sore throat, bleeding gums, joint or muscle pain, shortness of breath, chest pain, lymph  node swelling, infections, pains in calves, nausea, vomiting, diarrhea, burning with urination, depression/anxiety, numbness/tingling, or dizziness.  Positives are: none. All ROS are negative.     Meds:  List in EMR viewed today    Allergies:    List in EMR viewed today    Labs Reviewed:  -    Records Reviewed:   None    Areas Examined:  full body ***      ***

## 2023-08-10 NOTE — Progress Notes (Signed)
 ASSESSMENT & PLAN        Assessment & Plan  Resolved Wart  Wart on the scalp has resolved completely after treatment.  -No further treatment needed at this time.    Actinic Keratoses  Multiple small, mildly hyperkeratotic papules on the scalp, indicative of early actinic keratoses. Discussed the benefits of using 5-fluorouracil (5FU) cream for treatment.  -Prescribe 5FU 5% cream, apply twice daily to affected areas until development of scattered scabby areas or for a maximum of 21 days.  -Follow-up in April 2025 to evaluate response to treatment.    Health Maintenance  - Reviewed photo protection using sunscreen SPF 30 or higher qday to exposed areas, reapply every 1.5 hrs when outdoors, wearing big broad-brimmed hats, photo-protective UPF embedded clothing, monthly self check of the skin. Tanning response is direct result of DNA damage.               SUBJECTIVE      Dustin Ibarra is a 62yr old male.    History of Present Illness  The patient, with a history of a wart and precancerous scalp lesions, presents for a follow-up visit. The wart, which was the primary reason for this follow-up, has completely resolved. The patient reports that it 'flaked off one day' about two weeks after the last treatment. He has not noticed any recurrence or new skin lesions.    The patient also discusses the precancerous lesions on his scalp. He had previously deferred treatment with a chemotherapy cream (5-fluorouracil) due to the holiday season. He expresses readiness to start the treatment now, having discussed it with a family member who had a similar condition and found the treatment manageable. The patient acknowledges the presence of red spots on his scalp, which he initially thought were the precancerous lesions.       OBJECTIVE      His vitals were not taken for this visit.         Physical Exam  SKIN: Scalp with scattered actinic keratoses, small, mildly hyperkeratotic papules.       Results             Total time I spent in  care of this patient today (excluding time spent on other billable services) was 25 minutes.     I obtained verbal consent from the patient to use AI ambient technology to transcribe the interactions between the patient and myself during the clinical encounter.

## 2023-08-16 ENCOUNTER — Ambulatory Visit: Payer: 59 | Admitting: CARDIOVASCULAR DIS

## 2023-08-16 DIAGNOSIS — R0602 Shortness of breath: Secondary | ICD-10-CM

## 2023-08-16 DIAGNOSIS — R0600 Dyspnea, unspecified: Secondary | ICD-10-CM

## 2023-08-16 NOTE — Progress Notes (Signed)
 Dear Dr Sharalyn Ink was seen today for an exercise stress test to evaluate shortness of breath.        Findings/results described to patient in detail at the end of the exam.       Impression:  The stress test was negative for ischemia.       Limitations of stress testing reviewed with patient including recommendation to seek medical attention for any worsening symptoms especially with exertion.  Risks of false negative study discussed.     Arrhythmias: PVCs  BP response: Normal     Medication changes recommended: None     Follow up: With Dr. Hessie Diener       A copy of the recommendations was  to the referring doctor.    Thank you    Chrissie Noa  M.D.  Cardiology, PCN, [facility]

## 2023-08-19 LAB — ELECTROCARDIOGRAM WITH RHYTHM STRIP: QTC: 431

## 2023-09-14 ENCOUNTER — Encounter: Payer: Self-pay | Admitting: Dermatology

## 2023-09-14 NOTE — Telephone Encounter (Signed)
Waiting for DR Renaldo Reel responds.  Odetta Pink RN  [facility]  [phone]

## 2023-09-20 ENCOUNTER — Ambulatory Visit: Payer: Self-pay | Admitting: Family Medicine

## 2023-09-20 NOTE — Telephone Encounter (Signed)
3 patient identifiers used.  Per:   patient.    Disposition: HOME CARE   Advice given per protocol   Per:   patient verbalizes agreement to plan. Agrees to callback with any increase in symptoms/concerns or questions.    See Assessment Below  Started Friday with productive cough with yellow/green phlegm. Feels febrile with chills at night but has not checked temperature. BP 111/62, pulse 102.     See Care Advice Below  Care Advice            Cough - Acute Productive-ADULT-AH  C Bradin Mcadory, RN Mon Sep 20, 2023 10:11 AM      Care Advice       HOME CARE:  * You should be able to treat this at home.    REASSURANCE AND EDUCATION - COUGH:  * It doesn't sound like a serious cough.  * Coughing up mucus is very important for protecting the lungs from pneumonia.  * Here is some care advice that should help.    COUGH MEDICINES:  * COUGH DROPS: Over-the-counter cough drops can help a lot, especially for mild coughs. They soothe an irritated throat and remove the tickle sensation in the back of the throat. Cough drops are easy to carry with you.  * COUGH SYRUP WITH DEXTROMETHORPHAN: An over-the-counter cough syrup can help your cough. The most common cough suppressant in over-the-counter cough medicines is dextromethorphan.  * HOME REMEDY - HARD CANDY: Hard candy works just as well as over-the-counter cough drops. People who have diabetes should use sugar-free candy.  * HOME REMEDY - HONEY: This old home remedy has been shown to help decrease coughing at night. The adult dosage is 2 teaspoons (10 ml) at bedtime.    COUGH SYRUP WITH DEXTROMETHORPHAN:  * Cough syrups containing the cough suppressant dextromethorphan may help decrease your cough.  * Cough syrup works best for coughs that keep you awake at night. It can also sometimes help in the late stages of a lung or airway infection when the cough is dry and hacking. Cough syrup can be used along with cough drops.  * Examples: Delsym 12-hour Cough, Robitussin Cough Long-Acting,  Triaminic Long-Acting, Vicks DayQuil Cough.    COUGH SYRUP WITH DEXTROMETHORPHAN - EXTRA NOTES AND WARNINGS:  * Do not try to completely stop coughs that produce mucus and phlegm.  * Coughing is helpful. It brings up the mucus from the lungs and helps prevent pneumonia.  * RESEARCH: Some research studies show that dextromethorphan reduces the frequency and severity of cough in those 18 years and older without significant adverse effects. Other studies suggest that dextromethorphan is no better than placebo at reducing a cough.  * DRUG ABUSE: It should be noted that dextromethorphan has become a drug of abuse. This problem is seen most often in teenagers. Overdose symptoms can range from giggling and feeling high to hallucinations and coma.  * WARNING: Do not take dextromethorphan if you are taking a monoamine oxidase (MAO) inhibitor now or in the past 2 weeks. Examples of MAO inhibitors include isocarboxazid (Marplan), phenelzine (Nardil), selegiline (Eldepryl, Emsam, Zelapar), and tranylcypromine (Parnate).  * WARNING: Do not take dextromethorphan if you are taking venlafaxine (Effexor).  * Before taking any medicine, read all the instructions on the package.    HUMIDIFIER:  * If the air is dry, use a humidifier in the bedroom.  * Dry air makes coughs worse.    DRINK PLENTY OF LIQUIDS:  * Drink plenty of liquids.  *  Staying well-hydrated will help loosen phlegm.  * The liquids will also help soothe a dry or irritated throat.    EXPECTED COURSE:  * A viral upper respiratory infection (such as a cold or flu) causes a cough for 1 to 3 weeks.  * Sometimes you may cough up phlegm (mucus). The mucus can normally be white, gray, yellow or green for the first couple days.    CALL BACK IF:  * Cough lasts over 3 weeks  * Continuous coughing persists over 2 hours after cough treatment  * Difficulty breathing occurs  * Fever over 103 F (39.4 C)  * Fever lasts over 3 days  * You become worse                            See  Protocol and Disposition Below  Reason for Disposition   Cough    Additional Information   Negative: SEVERE difficulty breathing (e.g., struggling for each breath, speaks in single words)   Negative: Bluish (or gray) lips or face now   Negative: [1] Difficulty breathing AND [2] exposure to flames, smoke, or fumes   Negative: [1] Stridor AND [2] difficulty breathing   Negative: Sounds like a life-threatening emergency to the triager   Negative: [1] MODERATE difficulty breathing (e.g., speaks in phrases, SOB even at rest, pulse 100-120) AND [2] still present when not coughing   Negative: Chest pain  (Exception: MILD central chest pain, present only when coughing.)   Negative: Patient sounds very sick or weak to the triager   Negative: [1] MILD difficulty breathing (e.g., minimal/no SOB at rest, SOB with walking, pulse <100) AND [2] still present when not coughing   Negative: [1] Coughed up blood AND [2] > 1 tablespoon (15 ml)   (Exception: Blood-tinged sputum.)   Negative: Fever > 103 F (39.4 C)   Negative: [1] Fever > 101 F (38.3 C) AND [2] age > 60 years   Negative: [1] Fever > 100.0 F (37.8 C) AND [2] bedridden (e.g., CVA, chronic illness, recovering from surgery)   Negative: [1] Fever > 100.0 F (37.8 C) AND [2] diabetes mellitus or weak immune system (e.g., HIV positive, cancer chemo, splenectomy, organ transplant, chronic steroids)   Negative: Wheezing is present   Negative: SEVERE coughing spells (e.g., whooping sound after coughing, vomiting after coughing)   Negative: [1] Continuous (nonstop) coughing interferes with work or school AND [2] no improvement using cough treatment per Care Advice   Negative: Coughing up rusty-colored (reddish-brown) sputum   Negative: Fever present > 3 days (72 hours)   Negative: [1] Fever returns after gone for over 24 hours AND [2] symptoms worse or not improved   Negative: [1] Using nasal washes and pain medicine > 24 hours AND [2] sinus pain (around cheekbone or eye) persists    Negative: Earache   Negative: [1] Known COPD or other severe lung disease (i.e., bronchiectasis, cystic fibrosis, lung surgery) AND [2] worsening symptoms (i.e., increased sputum purulence or amount, increased breathing difficulty   Negative: Cough has been present for > 3 weeks   Negative: [1] Nasal discharge AND [2] present > 10 days   Negative: [1] Coughed up blood-tinged sputum AND [2] more than once   Negative: Exposure to TB (Tuberculosis)    Protocols used: Cough - Acute Productive-ADULT-AH    Jeral Pinch., RN  PCN Triage

## 2023-09-21 ENCOUNTER — Encounter: Payer: Self-pay | Admitting: Family Medicine

## 2023-09-21 ENCOUNTER — Ambulatory Visit: Payer: 59 | Admitting: Family Medicine

## 2023-09-21 VITALS — BP 112/75 | HR 99 | Temp 98.7°F | Wt 196.2 lb

## 2023-09-21 DIAGNOSIS — J111 Influenza due to unidentified influenza virus with other respiratory manifestations: Secondary | ICD-10-CM

## 2023-09-21 DIAGNOSIS — J069 Acute upper respiratory infection, unspecified: Secondary | ICD-10-CM

## 2023-09-21 LAB — POC SARS-COV-2 + FLU A/B
POC Influenza A: DETECTED — AB
POC Influenza B: NOT DETECTED
POC SARS-COV-2: NOT DETECTED

## 2023-09-21 MED ORDER — OSELTAMIVIR 75 MG CAPSULE
75.0000 mg | ORAL_CAPSULE | Freq: Two times a day (BID) | ORAL | 0 refills | Status: AC
Start: 2023-09-21 — End: 2023-09-26

## 2023-09-21 NOTE — Nursing Note (Signed)
 Patient vitals taken, screened for pain and pharmacy confirmed.     Dewitt Hoes, MA

## 2023-09-21 NOTE — Progress Notes (Signed)
ASSESSMENT & PLAN        Assessment & Plan  Upper Respiratory Infection  Symptoms started on Friday with fever, chills, and congestion. No crackles heard on lung exam. Patient reports feeling disoriented and experiencing vertigo.  -Perform COVID and flu test today.  -Provide work note for missed days (Sunday, Monday) and upcoming days (Friday, Saturday).    Obstructive Sleep Apnea  Patient has not been using CPAP machine due to discomfort while sick.  -Advise patient to try using CPAP machine again.  -Provide note for work if needed to explain non-use of CPAP machine during illness.    Follow-up  Patient to return to work on Sunday, 09/26/2023. Follow-up appointment to be scheduled after patient's recovery from current illness.            SUBJECTIVE    16:01  Chief Complaint   Patient presents with    Cough    Congestion       Dustin Ibarra is a 63yr old male.    History of Present Illness  The patient, with a history of sleep apnea managed with CPAP, presents with flu-like symptoms since Friday. He describes a 'thick' feeling in his lungs, chills, fever, and significant fatigue. The symptoms have been severe enough to cause him to miss work and discontinue use of his CPAP machine. He also reports significant congestion, particularly at night, and a disoriented feeling. He experienced a severe episode of vertigo upon waking up quickly one day, which he attributes to eating a salty soup the night before. He has been isolating in his bedroom to avoid spreading the illness. He has also been working with a Systems analyst and has stopped drinking alcohol in an effort to improve his overall health.       OBJECTIVE      Blood pressure 112/75, pulse 99, temperature 37.1 C (98.7 F), temperature source Temporal, weight 89 kg (196 lb 3.4 oz), SpO2 97%.      General Appearance: cooperative.  Eyes:  conjunctivae and sclera normal.  Ears:  left canal ceruminous, right canal ceruminous, and TMs: TM not visualized bilaterally  secondary to cerumen.  Nose:  no sinus tenderness and clear rhinorrhea.  Mouth: normal.  Neck:  Neck supple. No adenopathy, thyroid symmetric, normal size.  Heart:  normal rate and regular rhythm, no murmurs, clicks, or gallops.  Lungs: rhonchi bibasilar.  Mental Status: Appearance/Cooperation: non-toxic, in no respiratory distress and acyanotic, alert, and well groomed and dressed  Attitude: pleasant   Behavior :normal  Eye Contact: normal  Attention Span: good  Speech: normal volume, rate, and pitch      (J06.9) Upper respiratory tract infection, unspecified type  (primary encounter diagnosis)  Comment:   Plan: POC SARS-COV-2 + FLU A/B, POC SARS-COV-2 + FLU         A/B            (J11.1) Influenza  Comment:   Plan: Oseltamivir (TAMIFLU) 75 mg Capsule                      I did review patient's past medical and family/social history, no changes noted.    Barriers to Learning assessed: none. Patient verbalizes understanding of teaching and instructions.     Electronically signed by   Brantley Stage MD    Associate Physician          Physical Exam         Results  Procedure: Nasopharyngeal Swab  Description:  The swab was inserted into the nostril, advanced to the nasopharynx, and rotated to collect a sample.    LABS  COVID-19: positive (09/20/2023)           Total time I spent in care of this patient today (excluding time spent on other billable services) was 15 minutes.     I obtained verbal consent from the patient to use AI ambient technology to transcribe the interactions between the patient and myself during the clinical encounter.

## 2023-09-21 NOTE — Patient Instructions (Signed)
 Recommend rest, increased oral fluid intake, humidifier, gargle for sore throat and apply facial warm packs for sinus pain. May use cough suppressant of choice MUCINEX DM  prn according to package directions. Call or return to [facility] if these symptoms worsen, fail to improve as anticipated, or if other symptoms develop.

## 2023-10-12 ENCOUNTER — Encounter: Payer: Self-pay | Admitting: Dermatology

## 2023-10-12 NOTE — Telephone Encounter (Signed)
 Sent secured msg to MD,and waiting for responds.    Odetta Pink RN  [facility]  [phone]

## 2024-04-21 ENCOUNTER — Other Ambulatory Visit: Payer: Self-pay | Admitting: Family Medicine

## 2024-04-21 DIAGNOSIS — I10 Essential (primary) hypertension: Secondary | ICD-10-CM

## 2024-04-21 DIAGNOSIS — Z125 Encounter for screening for malignant neoplasm of prostate: Secondary | ICD-10-CM

## 2024-04-21 DIAGNOSIS — E785 Hyperlipidemia, unspecified: Secondary | ICD-10-CM

## 2024-04-22 NOTE — Telephone Encounter (Signed)
 Plan was reviewed with the Pharmacy resident. I agree with the resident's plan of care and recommendations.      Per PRO workflow, approval or denial will be determined by the provider     Rexene Dales, PharmD

## 2024-04-22 NOTE — Telephone Encounter (Signed)
 Pharmacy Refill Optimization (PRO)    Refill request has been pended to the pharmacist for review under PRO CPA 690-00 04/22/2024     Meets PRO CPA 690-00: NO - Per PRO workflow, approval or denial will be determined by the provider   Medication has not been reviewed/reconciled in the past 12 months    See Protocol Details for additional information   ====================================================================    Medication name:   Requested Prescriptions     Pending Prescriptions Disp Refills    amLODIPine  (Norvasc) 5 mg tablet [Pharmacy Med Name: AMLODIPINE  BESYLATE 5 MG TAB] 90 tablet 3     Sig: Take 1 tablet by mouth every day. (blood pressure)    doxazosin  (Cardura ) 2 mg tablet [Pharmacy Med Name: DOXAZOSIN  MESYLATE 2 MG TAB] 90 tablet 3     Sig: TAKE 1 TABLET BY MOUTH EVERYDAY AT BEDTIME     Labs (if required by protocol): N/A

## 2024-05-23 ENCOUNTER — Ambulatory Visit: Admitting: Family Medicine

## 2024-05-23 ENCOUNTER — Encounter: Payer: Self-pay | Admitting: Family Medicine

## 2024-05-23 ENCOUNTER — Ambulatory Visit: Attending: Family Medicine

## 2024-05-23 VITALS — BP 130/80 | HR 72 | Temp 97.7°F | Wt 191.4 lb

## 2024-05-23 DIAGNOSIS — E785 Hyperlipidemia, unspecified: Secondary | ICD-10-CM

## 2024-05-23 DIAGNOSIS — R739 Hyperglycemia, unspecified: Secondary | ICD-10-CM

## 2024-05-23 DIAGNOSIS — Z23 Encounter for immunization: Secondary | ICD-10-CM

## 2024-05-23 DIAGNOSIS — I10 Essential (primary) hypertension: Secondary | ICD-10-CM

## 2024-05-23 DIAGNOSIS — Z125 Encounter for screening for malignant neoplasm of prostate: Secondary | ICD-10-CM | POA: Insufficient documentation

## 2024-05-23 DIAGNOSIS — Z0000 Encounter for general adult medical examination without abnormal findings: Secondary | ICD-10-CM

## 2024-05-23 DIAGNOSIS — Z1211 Encounter for screening for malignant neoplasm of colon: Secondary | ICD-10-CM

## 2024-05-23 LAB — COMPREHENSIVE METABOLIC PANEL
Alanine Transferase (ALT): 24 U/L (ref <=41)
Albumin: 4.4 g/dL (ref 4.0–4.9)
Alkaline Phosphatase (ALP): 83 U/L (ref 35–129)
Anion Gap: 12 mmol/L (ref 7–15)
Aspartate Transaminase (AST): 29 U/L (ref <=41)
Bilirubin Total: 0.5 mg/dL (ref <=1.2)
Calcium: 9.2 mg/dL (ref 8.6–10.0)
Carbon Dioxide Total: 25 mmol/L (ref 22–29)
Chloride: 100 mmol/L (ref 98–107)
Creatinine Serum: 0.81 mg/dL (ref 0.51–1.17)
E-GFR Creatinine (Male): 100 mL/min/1.73m*2
Glucose: 89 mg/dL (ref 74–109)
Potassium: 4.1 mmol/L (ref 3.4–5.1)
Protein: 7.1 g/dL (ref 6.6–8.7)
Sodium: 137 mmol/L (ref 136–145)
Urea Nitrogen, Blood (BUN): 10 mg/dL (ref 6–20)

## 2024-05-23 LAB — HEMOGLOBIN A1C
Hgb A1C,Glucose Est Avg: 105 mg/dL
Hgb A1C: 5.3 % (ref 3.9–5.6)

## 2024-05-23 LAB — LIPID PANEL WITH DLDL REFLEX
Cholesterol: 214 mg/dL — ABNORMAL HIGH (ref <200)
HDL Cholesterol: 71 mg/dL (ref >40)
LDL Cholesterol Calculation: 119 mg/dL — ABNORMAL HIGH (ref <100)
Non-HDL Cholesterol: 143 mg/dL (ref <=150)
Total Cholesterol: HDL Ratio: 3 (ref <4.0)
Triglyceride Level: 118 mg/dL (ref <150)

## 2024-05-23 LAB — PSA SCREEN: PSA Screen: 0.8 ng/mL (ref 0.0–4.5)

## 2024-05-23 NOTE — Patient Instructions (Addendum)
 Hypertension    to improve Bp , reduce:   1. Sodium:     Largest sources of sodium in [place] are      - Processed foods account for 75 % of American dietary intake      - Pizza, breads, rolls, crackers, luncheon meats, cold cuts and processed meats      - Canned foods such as soups and vegetables    2. Saturated Fat ( cheese, high-fat dairy , animal fats )    3. Alcohol : men limit to < 3 drinks / day ; women < 2/ day   4. Caffeine, excess weight , tobacco , stress and physical inactivity     Improve Bp by increasing intake of:    1. Potassium rich foods: potatoes, sweet potatoes , fruits ( bananas, cantaloupe, peaches ) ; Vegetables ( squash , broccoli , spinach )   Legumes ( white beans, lentils, soy , lima )    2. Calcium rich foods: Greens ( choose low-oxalate greens like collards, kale, turnip greens, mustard greens and bok choy)   Beans ( white beans, black-eyed peas , etc ) and Fortified non dairy milks .   3. Magnesium rich foods: Nuts ( almonds, cashews, peanuts )    Beans ( black, soy , kidney ) , Avocado, Quinoa    4. Garlic    5 Water only fasting

## 2024-05-23 NOTE — Progress Notes (Signed)
 ASSESSMENT & PLAN        Assessment & Plan  Adult Wellness Visit  Routine wellness visit with focus on exercise, medication review, and alcohol use.  - Administer COVID and flu vaccines today.  - Order blood tests for liver, kidney function, cholesterol, and blood sugar.  - Encourage continuation of exercise routine and healthy eating habits.  - Discuss alcohol use and encourage reduction in intake.    Essential hypertension  Blood pressure at 130/80 mmHg, managed with amlodipine  and doxazosin . Discussed importance of blood pressure control to reduce cardiovascular risk.  - Recheck blood pressure after 5 minutes of rest.  - Continue current antihypertensive medications.    Hyperlipidemia  Total cholesterol 236 mg/dL, LDL 862 mg/dL. Discussed lowering LDL below 100 mg/dL and total cholesterol below 200 mg/dL to reduce cardiovascular risk.  - Order lipid panel to assess current cholesterol levels.    Consumes alcohol 3-4 days a week, 3-4 drinks per occasion. Discussed impact on liver, kidney health, and gut flora. Encouraged reduction in intake and benefits of probiotics and prebiotics.  - Encourage reduction in alcohol intake.  - Discuss potential benefits of probiotics and prebiotics for gut health.    Hyperglycemia  Monitoring blood sugar levels as part of routine lab work.  - Order blood tests to check blood sugar levels.    Prostate health monitoring  Taking doxazosin  for urine flow. Discussed lifestyle changes' impact on prostate health.  - Monitor prostate health with lifestyle modifications.            SUBJECTIVE      08:30    Chief Complaint   Patient presents with    Physical       Dustin Ibarra is a 63yr old male.     He exercises more now at [place]   He has been under stress due to work in [place] , so been using alcohol lately    3-4 days a week , IPA or whiskey ( 3-4 )   History of Present Illness  Dustin Ibarra is a 63 year old male who presents for vaccination and routine follow-up.    He is on amlodipine   5 mg and doxazosin  at bedtime. He experiences urinary flow issues and has increased alcohol consumption to three to four days a week, with three to four glasses of IPA or whiskey each time. He is concerned about alcohol's impact on his kidneys and liver. Multiple morning bowel movements improve with alcohol avoidance.    A stress test last December showed sinus arrhythmia and PVCs. He has no palpitations but is concerned about heart health after a friend's death. Cholesterol levels were elevated last August, with a total of 236 mg/dL and LDL of 862 mg/dL.    He has not completed a colonoscopy despite an order in January. His family history includes a brother-in-law who died from colon cancer.   Review of Systems -   ENT:   SAR           Eye:  nuclear sclerosis , myopia   Neck: no pain, no swollen glands, normal ROM  Lungs: no Cough or DOE .  palpitations  GI: negative  GU: nocturia  Musculoskelatal: negative  Neuro: negative  SKin: AK  Psych: stress with work       OBJECTIVE      Blood pressure 130/80, pulse 72, temperature 36.5 C (97.7 F), temperature source Temporal, weight 86.8 kg (191 lb 5.8 oz), SpO2 97%.  General Appearance: healthy, alert, no distress, pleasant affect, cooperative.  Eyes:  conjunctivae and sclera normal.  Ears:  normal TMs and canal and external inspection of ears show no abnormality.  Nose:  normal.  Mouth: normal.  Neck:  Neck supple. No adenopathy, thyroid  symmetric, normal size.  Heart:  normal rate and regular rhythm, no murmurs, clicks, or gallops.  Lungs: clear to auscultation.  Abdomen: symmetric, no masses palpable, no organomegaly, bowel sounds normal, no bruits heard, soft, non-tender, obese.  Extremities:  no cyanosis, clubbing, or edema.  Neuro: speech normal, mental status intact, muscle tone normal, muscle strength normal .  Mental Status: Appearance/Cooperation: in no apparent distress, well developed and well nourished, non-toxic, in no respiratory distress and  acyanotic, alert, and well groomed and dressed  Attitude: pleasant   Behavior :normal  Eye Contact: normal  Attention Span: good  Speech: normal volume, rate, and pitch  Mood (pt's report) :Mood pt's report, euthymic  Affect: full and appropriate  Musculoskeletal: back:negative findings: symmetric, no tenderness      (Z00.00) Routine general medical examination at a health care facility  (primary encounter diagnosis)  Comment:   Plan: follow up yearly     (I10) HTN, goal below 130/80  Comment:   Plan: improved on recheck     (E78.5) Hyperlipidemia with target LDL less than 100  Comment:   Plan: Lipid Panel with DLDL Reflex, Comprehensive         Metabolic Panel, Hemoglobin A1C            (Z12.5) Screening PSA (prostate specific antigen)  Comment:   Plan: PSA Screen            (R73.9) Hyperglycemia  Comment:   Plan: Comprehensive Metabolic Panel, Hemoglobin A1C            (Z23) Flu vaccine need  Comment:   Plan: INFLUENZA, TRIVALENT, PF (FLULAVAL , FLUZONE )         VACCINE, IM (30M - 64Y)            (Z23) Need for COVID-19 vaccine  Comment:   Plan: 2025-2026 COVID-19 VACCINE, MODERNA [SPIKEVAX]         (62YO+)            (Z12.11) Screen for colon cancer  Comment:   Plan: do follow C scope       I did review patient's past medical and family/social history, no changes noted.    Barriers to Learning assessed: none. Patient verbalizes understanding of teaching and instructions.     Electronically signed by   Debby Redbird MD    Associate Physician              Physical Exam  VITALS: BP- 130/80  MEASUREMENTS: Weight- 190.       Results  LABS  Total Cholesterol: 236 (03/2023)  LDL: 137 (03/2023)    DIAGNOSTIC  Stress Test: Negative (07/2023)           Total time I spent in care of this patient today (excluding time spent on other billable services) was 30 minutes.     I obtained verbal consent from the patient to use AI ambient technology to transcribe the interactions between the patient and myself during the clinical  encounter.          Answers submitted by the patient for this visit:  Patient Health Questionnaire (Submitted on 05/23/2024)  PHQ-2 Score:: 0

## 2024-05-23 NOTE — Nursing Note (Signed)
 Patient identified by using (2) patient identifier.    Patient vitals taken, screened for pain and pharmacy confirmed.       Barb Bonito, MA

## 2024-05-23 NOTE — Nursing Note (Signed)
 Immunization VIS documentation(s) were given to patient/caregiver or legal representative to review. All questions were answered and the patient/caregiver or legal representative consented to the Immunization(s) being given. Patient allergies were reviewed and no contraindications were found. The immunization(s) were given as ordered. The patient was observed for any immediate reactions to the vaccine. None were observed.     Dewitt Hoes, MA

## 2024-07-28 ENCOUNTER — Other Ambulatory Visit: Payer: Self-pay | Admitting: Family Medicine

## 2024-07-28 DIAGNOSIS — I10 Essential (primary) hypertension: Secondary | ICD-10-CM

## 2024-07-28 NOTE — Telephone Encounter (Signed)
 Pharmacy Refill Optimization (PRO)    Refill authorized per PRO CPA 690-00 07/28/2024    Meets PRO CPA 690-00: YES    See Protocol Details for additional information   ====================================================================    Medication name:   Requested Prescriptions     Pending Prescriptions Disp Refills    amLODIPine  (Norvasc) 5 mg tablet [Pharmacy Med Name: AMLODIPINE  BESYLATE 5 MG TAB] 90 tablet 0     Sig: Take 1 tablet by mouth every day. (blood pressure)    doxazosin  (Cardura ) 2 mg tablet [Pharmacy Med Name: DOXAZOSIN  MESYLATE 2 MG TAB] 90 tablet 0     Sig: TAKE 1 TABLET BY MOUTH EVERYDAY AT BEDTIME     Labs (if required by protocol): N/A  Reviewed 05/23/24

## 2024-12-06 ENCOUNTER — Ambulatory Visit: Admitting: Family Medicine
